# Patient Record
Sex: Female | Born: 1945 | Race: White | Hispanic: No | Marital: Married | Smoking: Never smoker
Health system: Southern US, Community
[De-identification: ages and names within clinical notes are randomized; demographics above are authoritative.]

## PROBLEM LIST (undated history)

## (undated) DIAGNOSIS — C679 Malignant neoplasm of bladder, unspecified: Secondary | ICD-10-CM

## (undated) DIAGNOSIS — K219 Gastro-esophageal reflux disease without esophagitis: Secondary | ICD-10-CM

## (undated) DIAGNOSIS — I10 Essential (primary) hypertension: Secondary | ICD-10-CM

## (undated) DIAGNOSIS — N19 Unspecified kidney failure: Secondary | ICD-10-CM

## (undated) HISTORY — DX: Essential (primary) hypertension: I10

## (undated) HISTORY — DX: Gastro-esophageal reflux disease without esophagitis: K21.9

## (undated) HISTORY — DX: Malignant neoplasm of bladder, unspecified: C67.9

## (undated) HISTORY — PX: NEPHROSTOMY: SHX1014

## (undated) HISTORY — DX: Hypomagnesemia: E83.42

## (undated) HISTORY — DX: Unspecified kidney failure: N19

## (undated) HISTORY — PX: ABDOMINAL HYSTERECTOMY: SHX81

---

## 2010-06-06 LAB — SURGICAL PCR SCREEN: Staphylococcus aureus: NEGATIVE

## 2010-06-06 LAB — CBC
HCT: 40.3 % (ref 36.0–46.0)
Hemoglobin: 13.6 g/dL (ref 12.0–15.0)
MCH: 31 pg (ref 26.0–34.0)
RBC: 4.39 MIL/uL (ref 3.87–5.11)

## 2010-06-06 LAB — BASIC METABOLIC PANEL
CO2: 29 mEq/L (ref 19–32)
Calcium: 9.4 mg/dL (ref 8.4–10.5)
Chloride: 106 mEq/L (ref 96–112)
GFR calc Af Amer: >60 mL/min (ref >60)
Potassium: 4.2 mEq/L (ref 3.5–5.1)
Sodium: 141 mEq/L (ref 135–145)

## 2010-06-15 ENCOUNTER — Inpatient Hospital Stay (HOSPITAL_COMMUNITY)
Admission: RE | Admit: 2010-06-15 | Discharge: 2010-06-20 | DRG: 658 | Disposition: A | Discharge status: Home / Self Care | Payer: Managed Care, Other (non HMO) | Source: Ambulatory Visit | Attending: Urology | Admitting: Urology

## 2010-06-15 ENCOUNTER — Other Ambulatory Visit: Payer: Self-pay | Admitting: Urology

## 2010-06-15 DIAGNOSIS — K219 Gastro-esophageal reflux disease without esophagitis: Secondary | ICD-10-CM | POA: Diagnosis present

## 2010-06-15 DIAGNOSIS — I10 Essential (primary) hypertension: Secondary | ICD-10-CM | POA: Diagnosis present

## 2010-06-15 DIAGNOSIS — Q628 Other congenital malformations of ureter: Secondary | ICD-10-CM

## 2010-06-15 DIAGNOSIS — C659 Malignant neoplasm of unspecified renal pelvis: Principal | ICD-10-CM | POA: Diagnosis present

## 2010-06-15 LAB — TYPE AND SCREEN: Antibody Screen: NEGATIVE

## 2010-06-15 LAB — ABO/RH: ABO/RH(D): A POS

## 2010-06-15 LAB — BASIC METABOLIC PANEL
BUN: 7 mg/dL (ref 6–23)
CO2: 26 mEq/L (ref 19–32)
Chloride: 109 mEq/L (ref 96–112)
Creatinine, Ser: 0.94 mg/dL (ref 0.4–1.2)

## 2010-06-15 LAB — HEMOGLOBIN AND HEMATOCRIT, BLOOD: HCT: 33.6 % — ABNORMAL LOW (ref 36.0–46.0)

## 2010-06-16 LAB — HEMOGLOBIN AND HEMATOCRIT, BLOOD: Hemoglobin: 10.2 g/dL — ABNORMAL LOW (ref 12.0–15.0)

## 2010-06-16 LAB — BASIC METABOLIC PANEL
BUN: 8 mg/dL (ref 6–23)
Chloride: 109 mEq/L (ref 96–112)
Glucose, Bld: 170 mg/dL — ABNORMAL HIGH (ref 70–99)
Potassium: 4.2 mEq/L (ref 3.5–5.1)

## 2010-06-17 LAB — BASIC METABOLIC PANEL
BUN: 8 mg/dL (ref 6–23)
CO2: 28 mEq/L (ref 19–32)
Chloride: 108 mEq/L (ref 96–112)
Creatinine, Ser: 1.19 mg/dL (ref 0.4–1.2)

## 2010-06-18 NOTE — Op Note (Signed)
NAMEJULIETT, Jodi [name]                ACCOUNT NO.:  [id number]  MEDICAL RECORD NO.:  [id number]           PATIENT TYPE:  I  LOCATION:  0005                         FACILITY:  [facility]  PHYSICIAN:  Heloise Purpura, MD      DATE OF BIRTH:  March 15, 1946  DATE OF PROCEDURE:  06/15/2010 DATE OF DISCHARGE:                              OPERATIVE REPORT   PREOPERATIVE DIAGNOSIS:  Right renal pelvic urothelial carcinoma.  POSTOPERATIVE DIAGNOSIS:  Right renal pelvic urothelial carcinoma.  PROCEDURE:  Right laparoscopic nephroureterectomy with open distal ureterectomy.  SURGEON:  Heloise Purpura, MD  ASSISTANT:  Delia Chimes, NP  ANESTHESIA:  General.  COMPLICATIONS:  None.  ESTIMATED BLOOD LOSS:  400 cc.  INTRAVENOUS FLUIDS:  3800 cc of crystalloid.  SPECIMENS: 1. Right kidney and ureter. 2. Right distal ureter and bladder cuff.  DISPOSITION:  Specimens sent to Pathology.  DRAINS: 1. A 16-French 3-way Foley catheter. 2. A #15 Blake pelvic drain.  INDICATION:  Ms. Buis is a 65 year old female who developed gross hematuria and underwent an evaluation including a CT scan of the abdomen and pelvis which demonstrated findings concerning for a filling defect in the right renal pelvis.  She was taken to the operating room by Dr. Theresia Lo in [place], [place] and she underwent ureteroscopy and was found have multifocal tumors in the right renal pelvis.  These were biopsy-proven urothelial carcinoma, and after discussing management options for treatment and undergoing further evaluation excluding any other abnormalities, she elected to proceed with the above procedures. 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  The potential risks, complications, and alternative options associated with the above procedure were discussed in detail and informed  consent was obtained.  DESCRIPTION OF PROCEDURE:  The patient was taken to the operating room and a general anesthetic was administered.  She was given preoperative antibiotics, placed in the right modified flank position, and prepped and draped in the usual sterile fashion.  Care was taken to pad all potential pressure points.  A preoperative time-out was then performed. A site was selected just superior to the umbilicus for placement of the camera port.  This was placed using a standard open Hasson technique allowing entry into the peritoneal cavity under direct vision without difficulty.  0 Vicryl holding sutures were then placed into the fascia and a 12-mm Hasson cannula was placed.  A pneumoperitoneum was then established.  With a 30-degree lens, the abdomen was inspected and there was no evidence of any intra-abdominal injuries or other abnormalities. She was noted to have a large liver overlying the right kidney and required liver retraction.  A 5-mm port was placed in the right upper quadrant and a 12-mm port was placed in the right lower quadrant.  An additional 5-mm port was then placed in the far right lateral abdominal wall and a self-retaining liver retractor was placed to retract the liver cephalad.  Using the harmonic scalpel, the colon was reflected medially allowing entry  into the retroperitoneum and the ureter and gonadal vein were identified and lifted anteriorly.  A small gonadal artery was identified and was ligated between multiple Hem-o-lok clips. Dissection then proceeded superiorly toward the main renal hilum.  The patient was noted to have a branching renal artery with 2 larger renal arteries and 1 smaller renal artery extending posterior to the renal vein.  She had a solitary renal vein which was identified.  Each of the renal artery branches was isolated with right-angle dissection and divided after ligation with 3 Hem-o-lok clips.  Once all arterial  inflow was ligated and divided, attention was turned to the renal vein.  Right- angle dissection was used to isolate the renal vein from the surrounding perinephric tissues.  Initially, a 45-mm Flex ETS stapler was placed onto the renal vein and preparations were made to fire.  However, the staple did not fire and it was able to be released without any untoward effects.  The staple was then removed and three 15-mm Hem-o-lok clips were placed onto the renal vein on the inferior vena cava side with one 15-mm clip placed on the kidney side.  The renal vein was then divided and the hepatorenal ligaments were taken down with the harmonic scalpel allowing the kidney to be freed.  The lateral attachments to the kidney were then also taken down with the harmonic scalpel and dissection proceeded inferiorly.  The ureter was then taken down into the pelvis and the colon was mobilized medially.  The ureter was freed up to a point overlying the iliac vessels.  The renal fossa was then examined and irrigated.  Hemostasis appeared excellent, and the liver retractor was taken down and removed.  The 12-mm port sites were closed with 0 Vicryl stitches placed laparoscopically and all remaining ports were removed under direct vision.  A lower midline incision was then made via the patient's prior lower midline scar and taken down through the subcutaneous tissues with electrocautery.  The rectus fascia was then identified and divided in the midline allowing entry into the peritoneal cavity.  The space of Retzius had been partially developed laparoscopically and now was completely developed allowing mobilization of the bladder.  The patient was noted to have a very deep and narrow pelvis and this made the ureteral dissection somewhat more technically challenging.  The surrounding small vascular structures were ligated with 5-mm Hem-o-lok clips and divided, and the ureter was taken down into the pelvis just  outside the bladder.  The patient's bladder was opened vertically allowing exposure of the right ureteral orifice. During dissection, the ureter became dislodged and the kidney was removed with the majority of the ureter.  However, there was a distal ureteral remnant.  This was then removed from inside the bladder with a 2-0 Vicryl stitch placed into the ureteral orifice.  The bladder mucosa was then incised with electrocautery, and the distal ureter was dissected free so that the ureter was removed in its entirety.  This hiatus in the bladder was then repaired with a 2-0 Vicryl running suture.  The bladder was then closed in 2 layers with 2 separate 2-0 Vicryl running sutures including an imbricating layer.  The bladder was then filled with sterile saline and there did not appear to be any leak from the bladder.  A #15 Blake drain was then brought through a separate stab incision in the right lower quadrant and positioned in the perivesical area.  It was secured to the skin with a nylon suture.  Attention then turned to closure of the rectus fascia.  Two separate #1 PDS sutures were used to close the fascia and secured in the midline of the incision.  The subcutaneous incision was then copiously irrigated and reapproximated at the skin level with staples along with the remaining laparoscopic incisions. Sterile dressings were applied.  The patient appeared to tolerate the procedure well and without complications.  She was able to be extubated and transferred to the recovery unit in satisfactory condition.     Heloise Purpura, MD     LB/MEDQ  D:  06/15/2010  T:  06/16/2010  Job:  [id number]  Electronically Signed by Heloise Purpura MD on 06/18/2010 09:21:47 PM

## 2010-06-19 ENCOUNTER — Inpatient Hospital Stay (HOSPITAL_COMMUNITY): Payer: Managed Care, Other (non HMO)

## 2010-06-19 LAB — BASIC METABOLIC PANEL
BUN: 6 mg/dL (ref 6–23)
CO2: 27 mEq/L (ref 19–32)
Chloride: 109 mEq/L (ref 96–112)
Glucose, Bld: 116 mg/dL — ABNORMAL HIGH (ref 70–99)
Potassium: 3.6 mEq/L (ref 3.5–5.1)

## 2010-06-19 MED ORDER — DIATRIZOATE MEGLUMINE 18 % UR SOLN: 125.0000 mL | Freq: Once | URETHRAL | Status: AC | PRN

## 2010-07-14 NOTE — Discharge Summary (Signed)
[name], [name]                ACCOUNT NO.:  [id number]  MEDICAL RECORD NO.:  [id number]           PATIENT TYPE:  I  LOCATION:  1433                         FACILITY:  [facility]  PHYSICIAN:  Jodi Purpura, MD      DATE OF BIRTH:  1945-11-19  DATE OF ADMISSION:  06/15/2010 DATE OF DISCHARGE:  06/20/2010                              DISCHARGE SUMMARY   ADMISSION DIAGNOSIS:  Right renal pelvic urothelial carcinoma.  DISCHARGE DIAGNOSIS:  Right renal pelvic urothelial carcinoma.  PROCEDURE:  Right laparoscopic nephroureterectomy with open distal ureterectomy.  HISTORY AND PHYSICAL:  For full details, please see admission history and physical.  Briefly, Jodi [name] is a 65 year old female who developed gross hematuria and underwent an evaluation including a CT scan of the abdomen and pelvis, which demonstrated findings concerning for a filling defect in the right renal pelvis.  She was taken to the operating room by Dr. Theresia [name] in [place], [place], and underwent ureteroscopy and was found to have multifocal tumors in the right renal pelvis.  These were biopsy-proven urothelial carcinoma and after discussing management options for treatment and undergoing further evaluation excluding any other abnormalities, she elected to proceed with surgical therapy and a laparoscopic right nephroureterectomy with open distal ureterectomy.  HOSPITAL COURSE:  On June 15, 2010, she was taken to the operating room where she underwent the above named procedures, in which she tolerated well and without complications.  Postoperatively, she was able to be transferred to a regular hospital room following recovery from anesthesia.  She was found to be hemodynamically stable as noted by postoperative hemoglobin of 11.6.  She was also hemodynamically stable on postoperative day #1 as noted by hemoglobin of 10.2.  Her serum creatinine remained within normal limits as noted postoperatively at 0.94  and on postoperative day #1 at 1.19.  Postoperatively, she was able to begin clear liquids that evening.  She was able to be transitioned to a regular diet over the course of the next 24-48 hours, in which she tolerated without nausea or vomiting.  She was also able to begin ambulation postoperatively, in which she did without any difficulty.  On postoperative day #3, she was found to have excellent urine output with minimal output from her perinephric drain, therefore, fluid was sent from her drain to check for creatinine.  It was found to be consistent with serum at 1.0, therefore her perinephric drain was removed.  On postoperative day #4, her cystogram was performed in Radiology, which showed no leak at her anastomotic site.  During her cystogram, she did have what seemed to be a vasovagal episode with complaints of nausea. She denied any chest pain or shortness of breath.  Although she did complain of some nausea and dizziness.  Therefore, it was decided that she would stay for further evaluation.  On the morning of postoperative day #5, she had no further episodes nor no further complaints of nausea or dizziness.  She was ambulating without difficulty and tolerating a regular diet.  Her pain was well managed.  Therefore, her Foley catheter was removed and she was able to be  discharged home in excellent condition.  DISPOSITION:  Home.  DISCHARGE INSTRUCTIONS:  She was instructed to be ambulatory, but specifically told to refrain from any heavy lifting, strenuous activity, or driving.  She was instructed to resume a regular diet.  DISCHARGE MEDICATIONS:  She was instructed to resume all home medications consisting of Prevacid, Bystolic, Tylenol, meclizine, Veramyst, Zyrtec, lisinopril, and Norvasc.  In addition, she was provided a prescription for Colace to use daily and Percocet to use as needed for pain.  She was also prescribed a prescription for Cipro to take twice daily for 3  days.  FOLLOWUP:  She will follow up in 10-14 days for further postoperative evaluation.  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

## 2011-02-12 ENCOUNTER — Other Ambulatory Visit: Payer: Self-pay

## 2017-08-30 ENCOUNTER — Inpatient Hospital Stay (HOSPITAL_COMMUNITY): Payer: Medicare Other | Attending: Hematology | Admitting: Hematology

## 2017-08-30 ENCOUNTER — Encounter (HOSPITAL_COMMUNITY): Payer: Self-pay | Admitting: Hematology

## 2017-08-30 DIAGNOSIS — R0609 Other forms of dyspnea: Secondary | ICD-10-CM | POA: Insufficient documentation

## 2017-08-30 DIAGNOSIS — C689 Malignant neoplasm of urinary organ, unspecified: Secondary | ICD-10-CM

## 2017-08-30 DIAGNOSIS — R634 Abnormal weight loss: Secondary | ICD-10-CM | POA: Insufficient documentation

## 2017-08-30 DIAGNOSIS — Z9221 Personal history of antineoplastic chemotherapy: Secondary | ICD-10-CM | POA: Insufficient documentation

## 2017-08-30 DIAGNOSIS — Z79899 Other long term (current) drug therapy: Secondary | ICD-10-CM | POA: Diagnosis not present

## 2017-08-30 DIAGNOSIS — I10 Essential (primary) hypertension: Secondary | ICD-10-CM | POA: Diagnosis not present

## 2017-08-30 DIAGNOSIS — Z7189 Other specified counseling: Secondary | ICD-10-CM

## 2017-08-30 DIAGNOSIS — C679 Malignant neoplasm of bladder, unspecified: Secondary | ICD-10-CM | POA: Insufficient documentation

## 2017-08-30 DIAGNOSIS — K219 Gastro-esophageal reflux disease without esophagitis: Secondary | ICD-10-CM | POA: Diagnosis not present

## 2017-08-30 DIAGNOSIS — C7800 Secondary malignant neoplasm of unspecified lung: Secondary | ICD-10-CM | POA: Diagnosis not present

## 2017-08-30 NOTE — Patient Instructions (Signed)
[facility] Discharge Instructions  Today you saw Dr. K.   Thank you for choosing [facility] to provide your oncology and hematology care.  To afford each patient quality time with our provider, please arrive at least 15 minutes before your scheduled appointment time.   If you have a lab appointment with the Cancer Center please come in thru the  Main Entrance and check in at the main information desk  You need to re-schedule your appointment should you arrive 10 or more minutes late.  We strive to give you quality time with our providers, and arriving late affects you and other patients whose appointments are after yours.  Also, if you no show three or more times for appointments you may be dismissed from the clinic at the providers discretion.     Again, thank you for choosing [facility].  Our hope is that these requests will decrease the amount of time that you wait before being seen by our physicians.       _____________________________________________________________  Should you have questions after your visit to [facility], please contact our office at [phone] between the hours of 8:30 a.m. and 4:30 p.m.  Voicemails left after 4:30 p.m. will not be returned until the following business day.  For prescription refill requests, have your pharmacy contact our office.       Resources For Cancer Patients and their Caregivers ? American Cancer Society: Can assist with transportation, wigs, general needs, runs Look Good Feel Better.        1-888-227-6333 ? Cancer Care: Provides financial assistance, online support groups, medication/co-pay assistance.  1-800-813-HOPE (4673) ? [facility] Assists [place] cancer patients and their families through emotional , educational and financial support.  336-427-4357 ? [place] DSS Where to apply for food  stamps, Medicaid and utility assistance. 336-342-1394 ? [facility]: Transportation to medical appointments. 336-347-2287 ? Social Security Administration: May apply for disability if have a Stage IV cancer. 336-342-7796 1-800-772-1213 ? [place] Aging, Disability and Transit Services: Assists with nutrition, care and transit needs. 336-349-2343  Cancer Center Support Programs:   > Cancer Support Group  2nd Tuesday of the month 1pm-2pm, Journey Room   > Creative Journey  3rd Tuesday of the month 1130am-1pm, Journey Room    

## 2017-08-30 NOTE — Progress Notes (Signed)
[facility] CONSULT NOTE  Patient Care Team: Joseph Art, MD as PCP - General (Internal Medicine)  CHIEF COMPLAINTS/PURPOSE OF CONSULTATION:  Metastatic bladder cancer with lung mets  HISTORY OF PRESENTING ILLNESS:  Jodi [name] 72 y.o. female is here for initial consultation for metastatic bladder cancer with lung mets.  She is a previous patient of Dr. Tomie China in [place], [place] who has elected to transfer her care to [facility].    Here today with her husband and daughter. Her husband Greenlee Ancheta) is also a patient of Dr. Tomie China who has recently transferred his care to [facility].    Recent CT scans from [place], [place] reviewed. There may have been slight progression of lung lesions at that time. She has been on Alimta; last chemo 07/19/17 Her last lung biopsy was ~2 years ago by Dr. Luciana Axe in [place], [place].   Cough is stable. She is able to do ADLs with rest breaks; this is her baseline.  Her shortness of breath with exertion and her cough have not worsened.  She has occasional productive cough "that is a light color and not a lot."  She has lost ~6 lbs or so in the past 1-2 months.  Appetite has not been very good this week; she has not been taking her appetite stimulant medication (Megace). She does not like to take it often because "it makes me wake up in the middle of the night hungry."   Overall, she feels like her appetite is okay.   Recommended sending off Foundation One, if it has not already been done in the past.  Will change treatment to Taxotere/Cyramza. Will try to get her approved to start therapy soon.     MEDICAL HISTORY:  Past Medical History:  Diagnosis Date  . Bladder cancer ([place])   . GERD (gastroesophageal reflux disease)   . Hypertension   . Hypomagnesemia   . Renal failure     SURGICAL HISTORY: History reviewed. No pertinent surgical history.  SOCIAL HISTORY: Social History   Socioeconomic  History  . Marital status: Married    Spouse name: Not on file  . Number of children: Not on file  . Years of education: Not on file  . Highest education level: Not on file  Occupational History  . Not on file  Social Needs  . Financial resource strain: Not on file  . Food insecurity:    Worry: Not on file    Inability: Not on file  . Transportation needs:    Medical: Not on file    Non-medical: Not on file  Tobacco Use  . Smoking status: Not on file  Substance and Sexual Activity  . Alcohol use: Not on file  . Drug use: Not on file  . Sexual activity: Not on file  Lifestyle  . Physical activity:    Days per week: Not on file    Minutes per session: Not on file  . Stress: Not on file  Relationships  . Social connections:    Talks on phone: Not on file    Gets together: Not on file    Attends religious service: Not on file    Active member of club or organization: Not on file    Attends meetings of clubs or organizations: Not on file    Relationship status: Not on file  . Intimate partner violence:    Fear of current or ex partner: Not on file  Emotionally abused: Not on file    Physically abused: Not on file    Forced sexual activity: Not on file  Other Topics Concern  . Not on file  Social History Narrative  . Not on file    FAMILY HISTORY: History reviewed. No pertinent family history.  ALLERGIES:  is allergic to lortab [hydrocodone-acetaminophen].  MEDICATIONS:  Current Outpatient Medications  Medication Sig Dispense Refill  . ALPRAZolam (XANAX PO) Take by mouth as needed.    . cholecalciferol (VITAMIN D) 400 units TABS tablet Take 400 Units by mouth daily.    . Cyanocobalamin (VITAMIN B 12 PO) Take 1 mg by mouth daily.    Marland Kitchen diltiazem (CARDIZEM CD) 240 MG 24 hr capsule 1 TABLET AT THE SAME TIME EACH DAY ONCE A DAY ORALLY  2  . folic acid (FOLVITE) 1 MG tablet Take 1 mg by mouth daily.  3  . guaiFENesin-codeine 100-10 MG/5ML syrup TAKE 10 ML BY MOUTH 4  TIMES A DAY AS NEEDED COUGH  1  . lansoprazole (PREVACID) 30 MG capsule Take 30 mg by mouth daily at 12 noon.    . magnesium oxide (MAG-OX) 400 MG tablet Take 1 tablet by mouth 3 (three) times daily.  3  . meclizine (ANTIVERT) 25 MG tablet Take 25 mg by mouth 3 (three) times daily as needed for dizziness.    . megestrol (MEGACE) 40 MG/ML suspension Take by mouth 2 (two) times daily.    . ondansetron (ZOFRAN-ODT) 4 MG disintegrating tablet Take 4 mg by mouth 3 (three) times daily.  3  . prochlorperazine (COMPAZINE) 10 MG tablet Take 10 mg by mouth every 6 (six) hours as needed for nausea or vomiting.    . TRANSDERM-SCOP, 1.5 MG, 1 MG/3DAYS APPLY 1 PATCH EVERY 72 HOURS  6   No current facility-administered medications for this visit.     REVIEW OF SYSTEMS:   Constitutional: Denies fevers, chills or abnormal night sweats Eyes: Denies blurriness of vision, double vision or watery eyes Ears, nose, mouth, throat, and face: Denies mucositis or sore throat Respiratory: Nonproductive cough is stable.  Dyspnea on exertion. Cardiovascular: Denies palpitation, chest discomfort or lower extremity swelling Gastrointestinal:  Denies nausea, heartburn or change in bowel habits Skin: Denies abnormal skin rashes Lymphatics: Denies new lymphadenopathy or easy bruising Neurological:Denies numbness, tingling or new weaknesses Behavioral/Psych: Mood is stable, no new changes  All other systems were reviewed with the patient and are negative.  PHYSICAL EXAMINATION: ECOG PERFORMANCE STATUS: 1 - Symptomatic but completely ambulatory  Vitals:   08/30/17 1014  BP: (!) 140/92  Pulse: (!) 128  Resp: 18  Temp: 98.2 F (36.8 C)  SpO2: 97%   Filed Weights   08/30/17 1014  Weight: 117 lb 12.8 oz (53.4 kg)    GENERAL:alert, no distress and comfortable SKIN: skin color, texture, turgor are normal, no rashes or significant lesions EYES: normal, conjunctiva are pink and non-injected, sclera  clear OROPHARYNX:no exudate, no erythema and lips, buccal mucosa, and tongue normal  NECK: supple, thyroid normal size, non-tender, without nodularity LYMPH:  no palpable lymphadenopathy in the cervical, axillary or inguinal LUNGS: clear to auscultation and percussion with normal breathing effort HEART: regular rate & rhythm and no murmurs and no lower extremity edema ABDOMEN:abdomen soft, non-tender and normal bowel sounds Musculoskeletal:no cyanosis of digits and no clubbing  PSYCH: alert & oriented x 3 with fluent speech NEURO: no focal motor/sensory deficits  LABORATORY DATA:  I have reviewed the data as listed Lab Results  Component Value Date   WBC 6.9 06/06/2010   HGB 10.2 (L) 06/16/2010   HCT 30.0 (L) 06/16/2010   MCV 91.8 06/06/2010   PLT 207 06/06/2010     Chemistry      Component Value Date/Time   NA 141 06/19/2010 0500   K 3.6 DELTA CHECK NOTED 06/19/2010 0500   CL 109 06/19/2010 0500   CO2 27 06/19/2010 0500   BUN 6 06/19/2010 0500   CREATININE 0.96 06/19/2010 0500      Component Value Date/Time   CALCIUM 8.1 (L) 06/19/2010 0500       RADIOGRAPHIC STUDIES: I have personally reviewed the radiological images as listed and agreed with the findings in the report. CT scan dated 08/02/2017 shows interval worsening of diffuse bilateral pulmonary metastatic disease presented by solid and somewhat cavitary lung masses.  There has been subtle increase in the largest masses on the right and the right main to lung field and especially in the anterior medial right upper lobe and right lung apex.  ASSESSMENT & PLAN:  Bladder cancer metastasized to lung ([place]) 1.  Metastatic bladder cancer with lung, liver metastasis, mediastinal and abdominal adenopathy: - 6 cycles of gemcitabine and carboplatin from 06/08/2015 through 10/27/2015 - 8 cycles of Atezolizumab from 12/29/2015 through 07/05/2016, progressive disease in the liver, mediastinal and abdominal adenopathy -5 cycles of  Abraxane from 08/31/2016 through 12/28/2016, stable disease, discontinued secondary to pneumonitis - Single agent pemetrexed from 02/14/2017 through 07/19/2017, CT scan on 08/02/2017 showing some interval worsening of bilateral pulmonary metastatic disease with possible one new lung lesion. - Stable functional status with the ability to continue doing ADLs and IADLs, stable weight, stable cough - We have discussed further options including the results of RANGE trial using docetaxel 75 mg/m and Cyramza 10 mg/kg every 21-days which showed superior progression free survival over single agent chemotherapy.  We have also discussed about new FGFR3 inhibitor, Erdafitinib, which was FDA approved for metastatic bladder cancer.  We will plan to send her tumor for foundation one testing.  If there is no viable tumor, we will plan to do guardant 360.  We plan to start her on chemotherapy next week.  We talked about side effects of chemotherapy in detail.  2.  Nonproductive cough: She has cough predominantly at nighttime, tumor related.  She uses Cheratussin as needed.  3.  Weight loss: She will continue using Megace as needed.  Her weight has been stable lately.  No orders of the defined types were placed in this encounter.   All questions were answered. The patient knows to call the clinic with any problems, questions or concerns.   This note includes documentation from Mike Craze, NP, who was present during this patient's office visit and evaluation.  I have reviewed this note for its completeness and accuracy.  I have edited this note accordingly based on my findings and medical opinion.      Orders placed this encounter:  No orders of the defined types were placed in this encounter.    Derek Jack, MD 08/30/2017 7:22 PM

## 2017-08-30 NOTE — Assessment & Plan Note (Signed)
1.  Metastatic bladder cancer with lung, liver metastasis, mediastinal and abdominal adenopathy: - 6 cycles of gemcitabine and carboplatin from 06/08/2015 through 10/27/2015 - 8 cycles of Atezolizumab from 12/29/2015 through 07/05/2016, progressive disease in the liver, mediastinal and abdominal adenopathy -5 cycles of Abraxane from 08/31/2016 through 12/28/2016, stable disease, discontinued secondary to pneumonitis - Single agent pemetrexed from 02/14/2017 through 07/19/2017, CT scan on 08/02/2017 showing some interval worsening of bilateral pulmonary metastatic disease with possible one new lung lesion. - Stable functional status with the ability to continue doing ADLs and IADLs, stable weight, stable cough - We have discussed further options including the results of RANGE trial using docetaxel 75 mg/m and Cyramza 10 mg/kg every 21-days which showed superior progression free survival over single agent chemotherapy.  We have also discussed about new FGFR3 inhibitor, Erdafitinib, which was FDA approved for metastatic bladder cancer.  We will plan to send her tumor for foundation one testing.  If there is no viable tumor, we will plan to do guardant 360.  We plan to start her on chemotherapy next week.  We talked about side effects of chemotherapy in detail.  2.  Nonproductive cough: She has cough predominantly at nighttime, tumor related.  She uses Cheratussin as needed.  3.  Weight loss: She will continue using Megace as needed.  Her weight has been stable lately.

## 2017-08-31 DIAGNOSIS — Z7189 Other specified counseling: Secondary | ICD-10-CM | POA: Insufficient documentation

## 2017-08-31 DIAGNOSIS — C689 Malignant neoplasm of urinary organ, unspecified: Secondary | ICD-10-CM | POA: Insufficient documentation

## 2017-08-31 NOTE — Progress Notes (Signed)
START OFF PATHWAY REGIMEN - Bladder   [id number]:Docetaxel + Ramucirumab q21 Days:   A cycle is every 21 days:     Ramucirumab      Docetaxel   **Always confirm dose/schedule in your pharmacy ordering system**    Patient Characteristics: Metastatic Disease, Third Line and Beyond AJCC M Category: M1b AJCC N Category: NX AJCC T Category: TX Current evidence of distant metastases<= Yes AJCC 8 Stage Grouping: IVB Line of Therapy: Third Line and Beyond  Intent of Therapy: Non-Curative / Palliative Intent, Discussed with Patient

## 2017-09-03 ENCOUNTER — Encounter: Payer: Self-pay | Admitting: General Practice

## 2017-09-03 NOTE — Progress Notes (Signed)
[facility] CSW Progress Notes  Attempt to reach patient to assess for psychosocial needs; both phone numbers in chart are not working numbers.  Unable to reach patient.  Edwyna Shell, LCSW Clinical Social Worker Phone:  [phone]

## 2017-09-04 ENCOUNTER — Encounter (HOSPITAL_COMMUNITY): Payer: Self-pay | Admitting: Emergency Medicine

## 2017-09-04 MED ORDER — LIDOCAINE-PRILOCAINE 2.5-2.5 % EX CREA: TOPICAL_CREAM | CUTANEOUS | 2 refills | Status: DC

## 2017-09-04 NOTE — Progress Notes (Signed)
Chemotherapy teaching pulled together. 

## 2017-09-04 NOTE — Patient Instructions (Signed)
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   We are going to treat you with taxotere and cyramza.  This will be given every 21 days.  This treatment is with palliative intent, which means you are treatable but not curable.  You will see the doctor regularly throughout treatment.  We monitor your lab work prior to every treatment.  The doctor monitors your response to treatment by the way you are feeling, your blood work, and scans periodically.  There will be wait times while you are here for treatment.  It will take about 30 minutes to 1 hour for blood work to come back.  Then pharmacy has to mix your medications.    Premedications given before chemotherapy include: Premeds: Benadryl and Tylenol: help prevent any kind of reaction to the chemotherapy.   Dexamethasone - steroid - given to reduce the risk of you having an allergic type reaction to the chemotherapy. Dex can cause you to feel energized, nervous/anxious/jittery, make you have trouble sleeping, and/or make you feel hot/flushed in the face/neck and/or look pink/red in the face/neck. These side effects will pass as the Dex wears off. (takes 20 minutes to infuse)   POTENTIAL SIDE EFFECTS OF TREATMENT: Docetaxel (Generic Name) Other Names: Taxotere  About This Drug Docetaxel is used to treat cancer. This drug is given in the vein (IV).  This will take 1 hour to infuse.  Possible Side Effects (More Common) . Bone marrow depression. This is a decrease in the number of white blood cells, red blood cells, and platelets. This may raise your risk of infection, make you tired and weak (fatigue), and raise your risk of bleeding. . Swelling of your legs, ankles, and/or feet. Steroids are often given to lessen this swelling. . Hair loss: Hair loss is often complete scalp hair loss and can involve loss of eyebrows, eyelashes, and pubic hair. You may notice this a few days or weeks  after treatment has started. There have been cases of permanent hair loss reported. . Loose bowel movements (diarrhea) that may last for a few days. . Nausea and throwing up (vomiting). These symptoms may happen within a few hours after your treatment and may last up to 24 hours. Medicines are available to stop or lessen these side effects. . Soreness of the mouth and throat. You may have red areas, white patches, or sores that hurt. . Effects on the nerves are called peripheral neuropathy. You may feel numbness, tingling, or pain in your hands and feet. It may be hard for you to button your clothes, open jars, or walk as usual. The effect on the nerves may get worse with more doses of the drug. These effects get better in some people after the drug is stopped but it does not get better in all people. . Skin and nail changes. You may develop a rash or have skin redness and swelling followed by peeling. Nail problems may occur such as changes in nail color, nails becoming thin or brittle, or loss of the nail. Steroids are often given to lessen these side effects. . Weakness that interferes with your daily activities. . This drug contains alcohol and may affect your central nervous system. The central nervous system is made up of your brain and spinal cord. You may feel drunk during and after your treatment and it can impair your ability to drive or use machinery for one to two hours after infusion.  Possible Side Effects (Less Common) . Skin and tissue irritation may involve redness, pain, warmth, or swelling at the IV site. This happens if the drug leaks out of the vein and into nearby tissue. . Changes in your liver function. Your doctor will check your liver function as needed. . Muscle and joint pain. . Effects on the heart: This drug can weaken the heart and lower heart function. Your heart function will be checked as needed. You may have trouble catching your breath, mainly during activities. You  may also have trouble breathing while lying down, and have swelling in your ankles. . This drug may cause an increased risk of developing a second cancer. . Skin and tissue irritation may involve redness, pain, warmth, or swelling at the IV site. This happens if the drug leaks out of the vein and into nearby tissue. Marland Kitchen Blurred vision or other changes in eyesight. . A rare risk of death is increased in people with liver problems. Do not take this drug if you have liver disease and talk to your doctor.  Allergic Reactions Serious allergic reactions, including anaphylaxis are rare. While you are getting this drug in your vein (IV), tell your nurse right away if you have any of these symptoms of an allergic reaction: . Trouble catching your breath . Feeling like your tongue or throat are swelling . Feeling your heart beat quickly or in a not normal way (palpitations) . Feeling dizzy or lightheaded . Flushing, itching, rash, and/or hives  Infusion Reactions While you are getting this drug in your vein (IV), you may have a reaction. Your nurse will check you closely for these signs: fever or shaking chills, flushing, facial swelling, feeling dizzy, headache, trouble breathing, rash, itching, chest tightness, or chest pain. Less serious reactions to this drug may also happen. You will be given medicines to help stop or lessen these symptoms. Your vital signs will be checked during the infusion. Tell your doctor or nurse right away if you have any of these symptoms at any time during the infusion and/or for the first 24 hours after getting this drug: . Fever, chills, or shaking chills . Feeling dizzy or lightheaded . Headache . Nausea or throwing up  Treating Side Effects . Drink 6-8 cups of fluids every day unless your doctor has told you to limit your fluid intake due to some other health problem. A cup is 8 ounces of fluid. If you throw up or have loose bowel . Talk with your nurse about getting a  wig before you lose your hair. Also, call the [facility] at 800-ACS-2345 to find out information about the " Look Good,Feel Better" program close to where you live. It is a free program where women undergoing chemotherapy can learn about wigs, turbans and scarves as well as makeup techniques and skin and nail care. . Do not put anything on a rash unless your doctor or nurse says you may. Keep the area around the rash clean and dry. Ask your doctor for medicine if your rash bothers you. . Mouth care is very important. Your mouth care should consist of routine, gentle cleaning of your teeth or dentures and rinsing your mouth with a mixture of 1/2 teaspoon of salt in 8 ounces of water or  teaspoon of baking soda in 8 ounces of water. This should be done at least after each meal and at bedtime. . If you have mouth sores, avoid mouthwash that has alcohol. Avoid alcohol and smoking  because they can bother your mouth and throat. . Ask your doctor or nurse about medicine to stop or lessen loose bowel movements, nausea, throwing up, and joint and muscle pain. . If you have numbness and tingling in your hands and feet, be careful when cooking, walking, and handling sharp objects and hot liquids.  Food and Drug Interactions There are no known interactions of docetaxel with food. This drug may interact with other medicines. Tell your doctor and pharmacist about all the medicines and dietary supplements (vitamins, minerals, herbs and others) that you are taking at this time. The safety and use of dietary supplements and alternative diets are often not known. Using these might affect your cancer or interfere with your treatment. Until more is known, you should not use dietary supplements or alternative diets without your cancer doctor's help.  When to Call the Doctor Call your doctor or nurse right away if you have any of these symptoms: . Fever of 100.5 F (38 C) or above . Chills . Easy bruising or  bleeding . Wheezing or trouble breathing . Rash or itching . Feeling dizzy or lightheaded . Loose bowel movements (diarrhea) more than 4 times a day or diarrhea with weakness or feeling lightheaded . Nausea that stops you from eating or drinking . Throwing up more than 3 times a day . Signs of liver problems: dark urine, pale bowel movements, bad stomach pain, feeling very tired and weak, unusual itching, or yellowing of the eyes or skin . Eye irritation, blurred vision or other changes in eyesight Call your doctor or nurse as soon as possible if any of these symptoms happen: . Decreased urine . Pain in your mouth or throat that makes it hard to eat or drink . Nausea that is not relieved by prescribed medicines . Rash that is not relieved by prescribed medicines . Numbness, tingling, decreased feeling or weakness in fingers, toes, arms, or legs . Trouble walking or changes in the way you walk, feeling clumsy when buttoning clothes, opening jars, or other routine hand motions . Swelling of legs, ankles, or feet . Weight gain of 5 pounds in one week (fluid retention) . Fatigue that interferes with your daily activities . Headache that does not go away . Extreme weakness that interferes with normal activities . While you are getting this drug, please tell your nurse right away if you have any pain, redness, or swelling at the site of the IV infusion . Symptoms of being drunk, confusion, or being very sleepy  Sexual Problems and Reproductive Concerns . Pregnancy warning: This drug may have harmful effects on the unborn child, so effective methods of birth control should be used during your cancer treatment. Genetic counseling is available for you to talk about the effects of this drug therapy on future pregnancies. Also, a genetic counselor can look at the possible risk of problems in the unborn baby due to this medicine if an exposure happens during pregnancy. . Breast feeding warning: It is  not known if this drug passes into breast milk. For this reason, women should talk to their doctor about the risks and benefits of breast feeding during treatment with this drug because this drug may enter the breast milk and badly harm a breast feeding baby.   Ramucirumab (Generic Name) Other Names: CyramzaTM  About This Drug Ramucirumab is a drug used to treat patients with certain types of stomach cancer. It is given in the vein (IV). Takes 1 hour to infuse.  Possible Side Effects (More Common) . High blood pressure . Loose bowel movements (diarrhea) . Infusion reactions. This can cause you to have shaking chills, back pain, flushing, and low blood pressure . Changes in liver function tests  Possible Side Effects (Less Common) . Headache . Low sodium level . Rash . Nose bleeds . Protein in the urine  Treating Side Effects . If you get a rash, do not put anything on it unless your doctor or nurse says you may. Keep the area around the rash clean and dry. Ask your doctor for medicine if your rash bothers you. A rash that looks like acne may happen on your face and upper back when taking this drug. Your doctor can give you medicine to help treat this. . Your doctor will check your sodium level, blood pressure, liver function tests and your urine while you are taking this drug. . If you have a nose bleed, sit with your head tipped slightly forward. Apply pressure by lightly pinching the bridge of your nose between your thumb and forefinger. Call your doctor if you feel dizzy or faint or if the bleeding doesn't stop after 10 to 15 minutes. . Ramucirumab may cause slow wound healing. Talk to your doctor before you have any surgeries. . Ask your doctor or nurse about medicine that is available to help stop or lessen the loose bowel movements. . Drink 6-8 cups of fluids each day unless your doctor has told you to limit your fluid intake due to some other health problem. A cup is 8 ounces of  fluid. If you throw up or have loose bowel movements, you should drink more fluids so that you do not become dehydrated (lack water in the body from losing too much fluid). Food and Drug Interactions . There are known interactions with ramucirumab and grapefruit. Do not eat grapefruit or drink grapefruit juice while taking this drug. . This drug may also interact with other medicines. Tell your doctor and pharmacist about all the medicines and dietary supplements (vitamins, minerals, herbs and others) that you are taking at this time. The safety and use of dietary supplements and alternative diets are often not known. Using these might affect your cancer or interfere with your treatment. Until more is known, you should not use dietary supplements or alternative diets without your cancer doctor's help.  When to Call the Doctor Call your doctor or nurse right away if you have any of these symptoms: . Headache . Nose bleed that does not stop after 10-15 minutes . Extreme fatigue that interferes with your daily activities . Loose bowel movements (diarrhea) 4 times or loose bowel movements with weakness or a feeling lightheaded Call your doctor or nurse as soon as possible if any of these symptoms happen: . Rash  Reproduction Concerns . Pregnancy warning: This drug may have harmful effects on the unborn baby so effective methods of birth control should be used by you and your partner during your cancer treatment and for at least 3 months after the last dose. . Genetic counseling is available for you to talk about the effects of this drug therapy on future pregnancies. Also, a genetic counselor can look at the possible risk of problems in the unborn baby due to this medicine if an exposure happens during pregnancy. . Breast feeding warning: Women should not breast feed during treatment because this drug could enter the breast milk and badly harm a breast feeding baby.    SELF CARE ACTIVITIES WHILE  ON  CHEMOTHERAPY: Hydration Increase your fluid intake 48 hours prior to treatment and drink at least 8 to 12 cups (64 ounces) of water/decaff beverages per day after treatment. You can still have your cup of coffee or soda but these beverages do not count as part of your 8 to 12 cups that you need to drink daily. No alcohol intake.  Medications Continue taking your normal prescription medication as prescribed.  If you start any new herbal or new supplements please let us know first to make sure it is safe.  Mouth Care Have teeth cleaned professionally before starting treatment. Keep dentures and partial plates clean. Use soft toothbrush and do not use mouthwashes that contain alcohol. Biotene is a good mouthwash that is available at most pharmacies or may be ordered by calling [phone]. Use warm salt water gargles (1 teaspoon salt per 1 quart warm water) before and after meals and at bedtime. Or you may rinse with 2 tablespoons of three-percent hydrogen peroxide mixed in eight ounces of water. If you are still having problems with your mouth or sores in your mouth please call the clinic. If you need dental work, please let the doctor know before you go for your appointment so that we can coordinate the best possible time for you in regards to your chemo regimen. You need to also let your dentist know that you are actively taking chemo. We may need to do labs prior to your dental appointment.  Skin Care Always use sunscreen that has not expired and with SPF (Sun Protection Factor) of 50 or higher. Wear hats to protect your head from the sun. Remember to use sunscreen on your hands, ears, face, & feet.  Use good moisturizing lotions such as udder cream, eucerin, or even Vaseline. Some chemotherapies can cause dry skin, color changes in your skin and nails.    . Avoid long, hot showers or baths. . Use gentle, fragrance-free soaps and laundry detergent. . Use moisturizers, preferably creams or  ointments rather than lotions because the thicker consistency is better at preventing skin dehydration. Apply the cream or ointment within 15 minutes of showering. Reapply moisturizer at night, and moisturize your hands every time after you wash them.  Hair Loss (if your doctor says your hair will fall out)  . If your doctor says that your hair is likely to fall out, decide before you begin chemo whether you want to wear a wig. You may want to shop before treatment to match your hair color. . Hats, turbans, and scarves can also camouflage hair loss, although some people prefer to leave their heads uncovered. If you go bare-headed outdoors, be sure to use sunscreen on your scalp. . Cut your hair short. It eases the inconvenience of shedding lots of hair, but it also can reduce the emotional impact of watching your hair fall out. . Don't perm or color your hair during chemotherapy. Those chemical treatments are already damaging to hair and can enhance hair loss. Once your chemo treatments are done and your hair has grown back, it's OK to resume dyeing or perming hair. With chemotherapy, hair loss is almost always temporary. But when it grows back, it may be a different color or texture. In older adults who still had hair color before chemotherapy, the new growth may be completely gray.  Often, new hair is very fine and soft.  Infection Prevention Please wash your hands for at least 30 seconds using warm soapy water. Handwashing is the #  1 way to prevent the spread of germs. Stay away from sick people or people who are getting over a cold. If you develop respiratory systems such as green/yellow mucus production or productive cough or persistent cough let us know and we will see if you need an antibiotic. It is a good idea to keep a pair of gloves on when going into grocery stores/[employer] to decrease your risk of coming into contact with germs on the carts, etc. Carry alcohol hand gel with you at all times and  use it frequently if out in public. If your temperature reaches 100.5 or higher please call the clinic and let us know.  If it is after hours or on the weekend please go to the ER if your temperature is over 100.5.  Please have your own personal thermometer at home to use.    Sex and bodily fluids If you are going to have sex, a condom must be used to protect the person that isn't taking chemotherapy. Chemo can decrease your libido (sex drive). For a few days after chemotherapy, chemotherapy can be excreted through your bodily fluids.  When using the toilet please close the lid and flush the toilet twice.  Do this for a few day after you have had chemotherapy.   Effects of chemotherapy on your sex life Some changes are simple and won't last long. They won't affect your sex life permanently. Sometimes you may feel: . too tired . not strong enough to be very active . sick or sore  . not in the mood . anxious or low Your anxiety might not seem related to sex. For example, you may be worried about the cancer and how your treatment is going. Or you may be worried about money, or about how you family are coping with your illness. These things can cause stress, which can affect your interest in sex. It's important to talk to your partner about how you feel. Remember - the changes to your sex life don't usually last long. There's usually no medical reason to stop having sex during chemo. The drugs won't have any long term physical effects on your performance or enjoyment of sex. Cancer can't be passed on to your partner during sex  Contraception It's important to use reliable contraception during treatment. Avoid getting pregnant while you or your partner are having chemotherapy. This is because the drugs may harm the baby. Sometimes chemotherapy drugs can leave a man or woman infertile.  This means you would not be able to have children in the future. You might want to talk to someone about permanent  infertility. It can be very difficult to learn that you may no longer be able to have children. Some people find counselling helpful. There might be ways to preserve your fertility, although this is easier for men than for women. You may want to speak to a fertility expert. You can talk about sperm banking or harvesting your eggs. You can also ask about other fertility options, such as donor eggs. If you have or have had breast cancer, your doctor might advise you not to take the contraceptive pill. This is because the hormones in it might affect the cancer.  It is not known for sure whether or not chemotherapy drugs can be passed on through semen or secretions from the vagina. Because of this some doctors advise people to use a barrier method if you have sex during treatment. This applies to vaginal, anal or oral sex. Generally, doctors  advise a barrier method only for the time you are actually having the treatment and for about a week after your treatment. Advice like this can be worrying, but this does not mean that you have to avoid being intimate with your partner. You can still have close contact with your partner and continue to enjoy sex.  Animals If you have cats or birds we just ask that you not change the litter or change the cage.  Please have someone else do this for you while you are on chemotherapy.   Food Safety During and After Cancer Treatment Food safety is important for people both during and after cancer treatment. Cancer and cancer treatments, such as chemotherapy, radiation therapy, and stem cell/bone marrow transplantation, often weaken the immune system. This makes it harder for your body to protect itself from foodborne illness, also called food poisoning. Foodborne illness is caused by eating food that contains harmful bacteria, parasites, or viruses.  Foods to avoid Some foods have a higher risk of becoming tainted with bacteria. These include: Marland Kitchen Unwashed fresh fruit and  vegetables, especially leafy vegetables that can hide dirt and other contaminants . Raw sprouts, such as alfalfa sprouts . Raw or undercooked beef, especially ground beef, or other raw or undercooked meat and poultry . Fatty, fried, or spicy foods immediately before or after treatment.  These can sit heavy on your stomach and make you feel nauseous. . Raw or undercooked shellfish, such as oysters. . Sushi and sashimi, which often contain raw fish.  . Unpasteurized beverages, such as unpasteurized fruit juices, raw milk, raw yogurt, or cider . Undercooked eggs, such as soft boiled, over easy, and poached; raw, unpasteurized eggs; or foods made with raw egg, such as homemade raw cookie dough and homemade mayonnaise Simple steps for food safety Shop smart. . Do not buy food stored or displayed in an unclean area. . Do not buy bruised or damaged fruits or vegetables. . Do not buy cans that have cracks, dents, or bulges. . Pick up foods that can spoil at the end of your shopping trip and store them in a cooler on the way home. Prepare and clean up foods carefully. . Rinse all fresh fruits and vegetables under running water, and dry them with a clean towel or paper towel. . Clean the top of cans before opening them. . After preparing food, wash your hands for 20 seconds with hot water and soap. Pay special attention to areas between fingers and under nails. . Clean your utensils and dishes with hot water and soap. Marland Kitchen Disinfect your kitchen and cutting boards using 1 teaspoon of liquid, unscented bleach mixed into 1 quart of water.   Dispose of old food. . Eat canned and packaged food before its expiration date (the "use by" or "best before" date). . Consume refrigerated leftovers within 3 to 4 days. After that time, throw out the food. Even if the food does not smell or look spoiled, it still may be unsafe. Some bacteria, such as Listeria, can grow even on foods stored in the refrigerator if they are  kept for too long. Take precautions when eating out. . At restaurants, avoid buffets and salad bars where food sits out for a long time and comes in contact with many people. Food can become contaminated when someone with a virus, often a norovirus, or another "bug" handles it. . Put any leftover food in a "to-go" container yourself, rather than having the server do it. And, refrigerate  leftovers as soon as you get home. . Choose restaurants that are clean and that are willing to prepare your food as you order it cooked.    MEDICATIONS:                                                                                                                                                          Zofran/Ondansetron 8mg  tablet. Take 1 tablet every 8 hours as needed for nausea/vomiting. (#1 nausea med to take, this can constipate)  Compazine/Prochlorperazine 10mg  tablet. Take 1 tablet every 6 hours as needed for nausea/vomiting. (#2 nausea med to take, this can make you sleepy)  EMLA cream. Apply a quarter size amount to port site 1 hour prior to chemo. Do not rub in. Cover with plastic wrap.   Over-the-Counter Meds: Miralax 1 capful in 8 oz of fluid daily. May increase to two times a day if needed. This is a stool softener. If this doesn't work proceed you can add: Senokot S-start with 1 tablet two times a day and increase to 4 tablets two times a day if needed. (total of 8 tablets in a 24 hour period). This is a stimulant laxative.   Call us if this does not help your bowels move.   Imodium 2mg  capsule. Take 2 capsules after the 1st loose stool and then 1 capsule every 2 hours until you go a total of 12 hours without having a loose stool. Call the [facility] if loose stools continue. If diarrhea occurs @ bedtime, take 2 capsules @ bedtime. Then take 2 capsules every 4 hours until morning. Call [facility].    Nausea Sheet  Zofran/Ondansetron 8mg  tablet. Take 1 tablet every 8 hours as needed for  nausea/vomiting. (#1 nausea med to take, this can constipate)  Compazine/Prochlorperazine 10mg  tablet. Take 1 tablet every 6 hours as needed for nausea/vomiting. (#2 nausea med to take, this can make you sleepy)  You can take these medications together or separately.  We would first like for you to try the Ondansetron by itself and then take the Prochloperizine if needed. But you are allowed to take both medications at the same time if your nausea is that severe.  If you are having persistent nausea (nausea that does not stop) please take these medications on a staggered schedule so that the nausea medication stays in your body.  Please call the [facility] and let us know the amount of nausea that you are experiencing.  If you begin to vomit, you need to call the [facility] and if it is the weekend and you have vomited more than one time and cant get it to stop-go to the Emergency Room.  Persistent nausea/vomiting can lead to dehydration (loss of fluid in your body) and will make you feel terrible.  Ice chips, sips of clear liquids, foods that are @ room temperature, crackers, and toast tend to be better tolerated.     Diarrhea Sheet  If you are having loose stools/diarrhea, please purchase Imodium and begin taking as outlined:  At the first sign of poorly formed or loose stools you should begin taking Imodium(loperamide) 2 mg capsules.  Take two caplets (4mg ) followed by one caplet (2mg ) every 2 hours until you have had no diarrhea for 12 hours.  During the night take two caplets (4mg ) at bedtime and continue every 4 hours during the night until the morning.  Stop taking Imodium only after there is no sign of diarrhea for 12 hours.    Always call the [facility] if you are having loose stools/diarrhea that you can't get under control.  Loose stools/diarrhea leads to dehydration (loss of water) in your body.  We have other options of trying to get the loose stools/diarrhea to stopped but you  must let us know!    Constipation Sheet *Miralax in 8 oz of fluid daily.  May increase to two times a day if needed.  This is a stool softener.  If this not enough to keep your bowel regular:  You can add:  *Senokot S, start with one tablet twice a day and can increase to 4 tablets twice a day if needed.  This is a stimulant laxative.   Sometimes when you take pain medication you need BOTH a medicine to keep your stool soft and a medicine to help your bowel push it out!  Please call if the above does not work for you.   Do not go more than 2 days without a bowel movement.  It is very important that you do not become constipated.  It will make you feel sick to your stomach (nausea) and can cause abdominal pain and vomiting.    SYMPTOMS TO REPORT AS SOON AS POSSIBLE AFTER TREATMENT:  FEVER GREATER THAN 100.5 F  CHILLS WITH OR WITHOUT FEVER  NAUSEA AND VOMITING THAT IS NOT CONTROLLED WITH YOUR NAUSEA MEDICATION  UNUSUAL SHORTNESS OF BREATH  UNUSUAL BRUISING OR BLEEDING  TENDERNESS IN MOUTH AND THROAT WITH OR WITHOUT PRESENCE OF ULCERS  URINARY PROBLEMS  BOWEL PROBLEMS  UNUSUAL RASH      Wear comfortable clothing and clothing appropriate for easy access to any Portacath or PICC line. Let us know if there is anything that we can do to make your therapy better!    What to do if you need assistance after hours or on the weekends: CALL [phone].  HOLD on the line, do not hang up.  You will hear multiple messages but at the end you will be connected with a nurse triage line.  They will contact the doctor if necessary.  Most of the time they will be able to assist you.   Do not call the hospital operator.      I have been informed and understand all of the instructions given to me and have received a copy. I have been instructed to call the clinic [phone] or my family physician as soon as possible for continued medical care, if indicated. I do not have any more  questions at this time but understand that I may call the [facility] or the Patient Navigator at [phone] during office hours should I have questions or need assistance in obtaining follow-up care.

## 2017-09-05 ENCOUNTER — Other Ambulatory Visit: Payer: Self-pay

## 2017-09-05 ENCOUNTER — Inpatient Hospital Stay (HOSPITAL_COMMUNITY): Payer: Medicare Other | Attending: Hematology

## 2017-09-05 ENCOUNTER — Encounter (HOSPITAL_COMMUNITY): Payer: Self-pay

## 2017-09-05 ENCOUNTER — Inpatient Hospital Stay (HOSPITAL_COMMUNITY): Payer: Medicare Other

## 2017-09-05 VITALS — BP 146/79 | HR 106 | Temp 98.2°F | Resp 18 | Ht 60.0 in | Wt 115.8 lb

## 2017-09-05 DIAGNOSIS — R590 Localized enlarged lymph nodes: Secondary | ICD-10-CM | POA: Insufficient documentation

## 2017-09-05 DIAGNOSIS — Z5112 Encounter for antineoplastic immunotherapy: Secondary | ICD-10-CM | POA: Insufficient documentation

## 2017-09-05 DIAGNOSIS — R05 Cough: Secondary | ICD-10-CM | POA: Diagnosis not present

## 2017-09-05 DIAGNOSIS — R634 Abnormal weight loss: Secondary | ICD-10-CM | POA: Insufficient documentation

## 2017-09-05 DIAGNOSIS — Z8709 Personal history of other diseases of the respiratory system: Secondary | ICD-10-CM

## 2017-09-05 DIAGNOSIS — K1230 Oral mucositis (ulcerative), unspecified: Secondary | ICD-10-CM | POA: Diagnosis not present

## 2017-09-05 DIAGNOSIS — C679 Malignant neoplasm of bladder, unspecified: Secondary | ICD-10-CM | POA: Insufficient documentation

## 2017-09-05 DIAGNOSIS — R5383 Other fatigue: Secondary | ICD-10-CM | POA: Diagnosis not present

## 2017-09-05 DIAGNOSIS — Z87898 Personal history of other specified conditions: Secondary | ICD-10-CM

## 2017-09-05 DIAGNOSIS — C7800 Secondary malignant neoplasm of unspecified lung: Secondary | ICD-10-CM | POA: Insufficient documentation

## 2017-09-05 DIAGNOSIS — Z79899 Other long term (current) drug therapy: Secondary | ICD-10-CM | POA: Diagnosis not present

## 2017-09-05 DIAGNOSIS — K219 Gastro-esophageal reflux disease without esophagitis: Secondary | ICD-10-CM | POA: Diagnosis not present

## 2017-09-05 DIAGNOSIS — C787 Secondary malignant neoplasm of liver and intrahepatic bile duct: Secondary | ICD-10-CM | POA: Diagnosis not present

## 2017-09-05 DIAGNOSIS — C689 Malignant neoplasm of urinary organ, unspecified: Secondary | ICD-10-CM

## 2017-09-05 DIAGNOSIS — Z5111 Encounter for antineoplastic chemotherapy: Secondary | ICD-10-CM | POA: Insufficient documentation

## 2017-09-05 DIAGNOSIS — K5900 Constipation, unspecified: Secondary | ICD-10-CM | POA: Insufficient documentation

## 2017-09-05 DIAGNOSIS — I10 Essential (primary) hypertension: Secondary | ICD-10-CM | POA: Diagnosis not present

## 2017-09-05 DIAGNOSIS — R0609 Other forms of dyspnea: Secondary | ICD-10-CM | POA: Diagnosis not present

## 2017-09-05 LAB — CBC WITH DIFFERENTIAL/PLATELET
BASOS ABS: 0 10*3/uL (ref 0.0–0.1)
BASOS PCT: 0 %
EOS PCT: 3 %
Eosinophils Absolute: 0.2 10*3/uL (ref 0.0–0.7)
HCT: 32.8 % — ABNORMAL LOW (ref 36.0–46.0)
Hemoglobin: 9.9 g/dL — ABNORMAL LOW (ref 12.0–15.0)
LYMPHS PCT: 8 %
Lymphs Abs: 0.6 10*3/uL — ABNORMAL LOW (ref 0.7–4.0)
MCH: 29.5 pg (ref 26.0–34.0)
MCHC: 30.2 g/dL (ref 30.0–36.0)
MCV: 97.6 fL (ref 78.0–100.0)
MONO ABS: 0.6 10*3/uL (ref 0.1–1.0)
Monocytes Relative: 7 %
Neutro Abs: 6.2 10*3/uL (ref 1.7–7.7)
Neutrophils Relative %: 82 %
PLATELETS: 272 10*3/uL (ref 150–400)
RBC: 3.36 MIL/uL — AB (ref 3.87–5.11)
RDW: 14.8 % (ref 11.5–15.5)
WBC: 7.6 10*3/uL (ref 4.0–10.5)

## 2017-09-05 LAB — COMPREHENSIVE METABOLIC PANEL
ALBUMIN: 3.2 g/dL — AB (ref 3.5–5.0)
ALT: 5 U/L — ABNORMAL LOW (ref 14–54)
ANION GAP: 12 (ref 5–15)
AST: 10 U/L — AB (ref 15–41)
Alkaline Phosphatase: 95 U/L (ref 38–126)
BILIRUBIN TOTAL: 0.8 mg/dL (ref 0.3–1.2)
BUN: 9 mg/dL (ref 6–20)
CALCIUM: 9.2 mg/dL (ref 8.9–10.3)
CO2: 25 mmol/L (ref 22–32)
Chloride: 100 mmol/L — ABNORMAL LOW (ref 101–111)
Creatinine, Ser: 0.91 mg/dL (ref 0.44–1.00)
GFR calc Af Amer: >60 mL/min (ref >60)
GLUCOSE: 105 mg/dL — AB (ref 65–99)
POTASSIUM: 4.1 mmol/L (ref 3.5–5.1)
Sodium: 137 mmol/L (ref 135–145)
TOTAL PROTEIN: 7.1 g/dL (ref 6.5–8.1)

## 2017-09-05 MED ORDER — SODIUM CHLORIDE 0.9 % IV SOLN
75.0000 mg/m2 | Freq: Once | INTRAVENOUS | Status: AC
  Administered 2017-09-05: 110 mg via INTRAVENOUS
  Filled 2017-09-05: qty 11

## 2017-09-05 MED ORDER — ACETAMINOPHEN 325 MG PO TABS
650.0000 mg | ORAL_TABLET | Freq: Once | ORAL | Status: AC
  Administered 2017-09-05: 650 mg via ORAL

## 2017-09-05 MED ORDER — SODIUM CHLORIDE 0.9 % IV SOLN
10.0000 mg/kg | Freq: Once | INTRAVENOUS | Status: AC
  Administered 2017-09-05: 500 mg via INTRAVENOUS
  Filled 2017-09-05: qty 50

## 2017-09-05 MED ORDER — PEGFILGRASTIM 6 MG/0.6ML ~~LOC~~ PSKT
6.0000 mg | PREFILLED_SYRINGE | Freq: Once | SUBCUTANEOUS | Status: AC
  Administered 2017-09-05: 6 mg via SUBCUTANEOUS
  Filled 2017-09-05: qty 0.6

## 2017-09-05 MED ORDER — SODIUM CHLORIDE 0.9 % IV SOLN
Freq: Once | INTRAVENOUS | Status: AC
  Administered 2017-09-05: 10:00:00 via INTRAVENOUS

## 2017-09-05 MED ORDER — HEPARIN SOD (PORK) LOCK FLUSH 100 UNIT/ML IV SOLN
500.0000 [IU] | Freq: Once | INTRAVENOUS | Status: AC | PRN
  Administered 2017-09-05: 500 [IU]
  Filled 2017-09-05: qty 5

## 2017-09-05 MED ORDER — FAMOTIDINE IN NACL 20-0.9 MG/50ML-% IV SOLN
INTRAVENOUS | Status: AC
  Filled 2017-09-05: qty 50

## 2017-09-05 MED ORDER — HYDROCOD POLST-CPM POLST ER 10-8 MG/5ML PO SUER
5.0000 mL | Freq: Once | ORAL | Status: AC
  Administered 2017-09-05: 5 mL via ORAL
  Filled 2017-09-05: qty 5

## 2017-09-05 MED ORDER — DIPHENHYDRAMINE HCL 50 MG/ML IJ SOLN
INTRAMUSCULAR | Status: AC
  Filled 2017-09-05: qty 1

## 2017-09-05 MED ORDER — HEPARIN SOD (PORK) LOCK FLUSH 100 UNIT/ML IV SOLN
INTRAVENOUS | Status: AC
  Filled 2017-09-05: qty 5

## 2017-09-05 MED ORDER — FAMOTIDINE IN NACL 20-0.9 MG/50ML-% IV SOLN
20.0000 mg | Freq: Two times a day (BID) | INTRAVENOUS | Status: DC
  Administered 2017-09-05: 20 mg via INTRAVENOUS

## 2017-09-05 MED ORDER — ACETAMINOPHEN 325 MG PO TABS
ORAL_TABLET | ORAL | Status: AC
  Filled 2017-09-05: qty 2

## 2017-09-05 MED ORDER — DIPHENHYDRAMINE HCL 50 MG/ML IJ SOLN
50.0000 mg | Freq: Once | INTRAMUSCULAR | Status: AC
  Administered 2017-09-05: 50 mg via INTRAVENOUS

## 2017-09-05 MED ORDER — SODIUM CHLORIDE 0.9 % IV SOLN
Freq: Once | INTRAVENOUS | Status: AC
  Administered 2017-09-05: 11:00:00 via INTRAVENOUS
  Filled 2017-09-05: qty 4

## 2017-09-05 NOTE — Progress Notes (Signed)
Tolerated infusions w/o adverse reaction.  Alert, in no distress.  VSS.  Discharged ambulatory in c/o daughter.

## 2017-09-06 ENCOUNTER — Other Ambulatory Visit (HOSPITAL_COMMUNITY): Payer: Self-pay | Admitting: Hematology

## 2017-09-06 ENCOUNTER — Encounter (HOSPITAL_COMMUNITY): Payer: Self-pay

## 2017-09-06 ENCOUNTER — Inpatient Hospital Stay (HOSPITAL_COMMUNITY): Payer: Medicare Other

## 2017-09-06 VITALS — BP 140/85 | HR 100 | Temp 98.4°F | Resp 18

## 2017-09-06 DIAGNOSIS — Z5111 Encounter for antineoplastic chemotherapy: Secondary | ICD-10-CM | POA: Diagnosis not present

## 2017-09-06 DIAGNOSIS — C7800 Secondary malignant neoplasm of unspecified lung: Principal | ICD-10-CM

## 2017-09-06 DIAGNOSIS — C689 Malignant neoplasm of urinary organ, unspecified: Secondary | ICD-10-CM

## 2017-09-06 DIAGNOSIS — C679 Malignant neoplasm of bladder, unspecified: Secondary | ICD-10-CM

## 2017-09-06 MED ORDER — PEGFILGRASTIM INJECTION 6 MG/0.6ML ~~LOC~~
6.0000 mg | PREFILLED_SYRINGE | Freq: Once | SUBCUTANEOUS | Status: AC
  Administered 2017-09-06: 6 mg via SUBCUTANEOUS

## 2017-09-06 MED ORDER — DEXAMETHASONE 4 MG PO TABS: 4.0000 mg | ORAL_TABLET | Freq: Two times a day (BID) | ORAL | 1 refills | Status: AC

## 2017-09-06 MED ORDER — PEGFILGRASTIM INJECTION 6 MG/0.6ML ~~LOC~~
PREFILLED_SYRINGE | SUBCUTANEOUS | Status: AC
Start: 2017-09-06 — End: 2017-09-06
  Filled 2017-09-06: qty 0.6

## 2017-09-06 NOTE — Patient Instructions (Signed)
[place] at [facility]  Discharge Instructions:  You received a neulasta shot today.  _______________________________________________________________  Thank you for choosing [place] at [facility] to provide your oncology and hematology care.  To afford each patient quality time with our providers, please arrive at least 15 minutes before your scheduled appointment.  You need to re-schedule your appointment if you arrive 10 or more minutes late.  We strive to give you quality time with our providers, and arriving late affects you and other patients whose appointments are after yours.  Also, if you no show three or more times for appointments you may be dismissed from the clinic.  Again, thank you for choosing [place] at [place] hope is that these requests will allow you access to exceptional care and in a timely manner. _______________________________________________________________  If you have questions after your visit, please contact our office at [phone] between the hours of 8:30 a.m. and 5:00 p.m. Voicemails left after 4:30 p.m. will not be returned until the following business day. _______________________________________________________________  For prescription refill requests, have your pharmacy contact our office. _______________________________________________________________  Recommendations made by the consultant and any test results will be sent to your referring physician. _______________________________________________________________

## 2017-09-06 NOTE — Progress Notes (Signed)
Patient called stating the sticky dressing was loose from the Neulasta Onpro with the cannula out of the skin.    Patient in for Neulasta shot.  Neulasta Onpro dressing was loose with the cannula out of the skin.  Patient stated no fevers or chills but having rash with prn itching.  Dr. Delton Coombes saw the patient in the treatment area and will order decadron by mouth as directed.  Patient verbalized understanding.  Patient tolerated neulasta shot with no complaints of pain.  Injection site clean and dry with no bruising or swelling noted at site.  Band aid applied.  Patient left ambulatory with daughter with no s/s of distress noted.

## 2017-09-13 ENCOUNTER — Other Ambulatory Visit (HOSPITAL_COMMUNITY): Payer: Self-pay

## 2017-09-13 ENCOUNTER — Encounter (HOSPITAL_COMMUNITY): Payer: Self-pay | Admitting: Adult Health

## 2017-09-13 ENCOUNTER — Telehealth (HOSPITAL_COMMUNITY): Payer: Self-pay

## 2017-09-13 MED ORDER — MISC. DEVICES MISC: 15.0000 mL | 2 refills | Status: DC | PRN

## 2017-09-13 NOTE — Progress Notes (Signed)
Received call from Maudie Mercury at [facility] re: the blocks/slides received from [place], [place] for Ms. Jodi [name].  Dr. Lyndon Code reviewed specimens; unfortunately, there is insufficient tissue for Foundation One testing.  Maudie Mercury would like to know if there is any additional testing that Dr. Delton Coombes would like before she returns the specimens back to [facility].    Discussed with Dr. Delton Coombes.  He will plan to order Guardant 360 blood testing rather than pursuing additional tissue testing at this time.  I let Maudie Mercury know that it was fine to return tissue to [facility] at this time.    Mike Craze, NP [place] [phone]

## 2017-09-13 NOTE — Telephone Encounter (Signed)
Daughter called this morning to inform us her mother has mouth sores. No thrush at this time. She is still able to eat and drink at this time. She has been using the lidocaine mouthwash she had. Dr. Worthy Keeler was informed and I called in a mouth wash script for the patient. Daughter called and made aware.

## 2017-09-25 NOTE — Progress Notes (Signed)
Jodi [name] FOLLOW-UP NOTE  Patient Care Team: Jodi Art, MD as PCP - General (Internal Medicine)  CHIEF COMPLAINTS:  Metastatic bladder cancer with lung mets  HISTORY OF PRESENTING ILLNESS:  Jodi [name] 72 y.o. female is here for initial consultation for metastatic bladder cancer with lung mets.  She is a previous patient of Dr. Tomie [name] in [place], [place] who has elected to transfer her care to [facility].    Here today with her husband and daughter. Her husband Jodi [name]) is also a patient of Dr. Tomie [name] who has recently transferred his care to [facility].    Recent CT scans from [place], [place] reviewed. There may have been slight progression of lung lesions at that time. She has been on Alimta; last chemo 07/19/17 Her last lung biopsy was ~2 years ago by Dr. Luciana [name] in [place], [place].   Cough is stable. She is able to do ADLs with rest breaks; this is her baseline.  Her shortness of breath with exertion and her cough have not worsened.  She has occasional productive cough "that is a light color and not a lot."  She has lost ~6 lbs or so in the past 1-2 months.  Appetite has not been very good this week; she has not been taking her appetite stimulant medication (Megace). She does not like to take it often because "it makes me wake up in the middle of the night hungry."   Overall, she feels like her appetite is okay.   Recommended sending off Foundation One, if it has not already been done in the past.  Will change treatment to Taxotere/Cyramza. Will try to get her approved to start therapy soon.     INTERVAL HISTORY:  Jodi [name] 73 y.o. female here for routine follow-up for metastatic bladder cancer.   Due for cycle #2 Docetaxel/Cyramza today.   She had mucositis, which is improved. Mouth sores started about 3 days after treatment; lasted about 1 week.  She had flushing, facial redness, and erythema to her hands which has  also resolved. Denies N&V or diarrhea. She has occasional constipation, "not more than normal."  Denies peripheral neuropathy. No fevers or infections.  Endorses occasional blood in her stools, which is worse when she is constipated. This is not new for her.  She continues to have cough, which is chronic. Occasionally her cough is productive.  She feels like her cough is a bit worse, particularly at night. She is requesting refill of her prescription cough medication.   Her BP and HR are elevated; she was previously on BP meds when she was being treated in [place]; she was on Losartan/HCTZ.  This was discontinued in [place] d/t low blood pressure at that time.    We will start her back on anti-hypertensive medication, since Cyramza can further elevated blood pressure going forward.    There was not enough tissue available from her tissue/blocks from [place], [place] to send for Foundation One. Will collect Guardant 360 blood testing today.     Will plan to dose-reduce chemo today based on side effects.     MEDICAL HISTORY:  Past Medical History:  Diagnosis Date  . Bladder cancer ([place])   . GERD (gastroesophageal reflux disease)   . Hypertension   . Hypomagnesemia   . Renal failure     SURGICAL HISTORY: History reviewed. No pertinent surgical history.  SOCIAL HISTORY: Social History   Socioeconomic History  . Marital status: Married  Spouse name: Not on file  . Number of children: Not on file  . Years of education: Not on file  . Highest education level: Not on file  Occupational History  . Not on file  Social Needs  . Financial resource strain: Not on file  . Food insecurity:    Worry: Not on file    Inability: Not on file  . Transportation needs:    Medical: Not on file    Non-medical: Not on file  Tobacco Use  . Smoking status: Not on file  Substance and Sexual Activity  . Alcohol use: Not on file  . Drug use: Not on file  . Sexual activity: Not on file   Lifestyle  . Physical activity:    Days per week: Not on file    Minutes per session: Not on file  . Stress: Not on file  Relationships  . Social connections:    Talks on phone: Not on file    Gets together: Not on file    Attends religious service: Not on file    Active member of club or organization: Not on file    Attends meetings of clubs or organizations: Not on file    Relationship status: Not on file  . Intimate partner violence:    Fear of current or ex partner: Not on file    Emotionally abused: Not on file    Physically abused: Not on file    Forced sexual activity: Not on file  Other Topics Concern  . Not on file  Social History Narrative  . Not on file    FAMILY HISTORY: History reviewed. No pertinent family history.  ALLERGIES:  is allergic to lortab [hydrocodone-acetaminophen].  MEDICATIONS:  Current Outpatient Medications  Medication Sig Dispense Refill  . ALPRAZolam (XANAX PO) Take by mouth as needed.    Marland Kitchen amLODipine (NORVASC) 5 MG tablet Take 1 tablet (5 mg total) by mouth daily. 30 tablet 6  . cholecalciferol (VITAMIN D) 400 units TABS tablet Take 400 Units by mouth daily.    . Cyanocobalamin (VITAMIN B 12 PO) Take 1 mg by mouth daily.    Marland Kitchen dexamethasone (DECADRON) 4 MG tablet Take 1 tablet (4 mg total) by mouth 2 (two) times daily with a meal. Take for 2-3 days after each chemo treatment 40 tablet 1  . diltiazem (CARDIZEM CD) 240 MG 24 hr capsule 1 TABLET AT THE SAME TIME EACH DAY ONCE A DAY ORALLY  2  . DOCEtaxel (TAXOTERE IV) Inject into the vein.    . folic acid (FOLVITE) 1 MG tablet Take 1 mg by mouth daily.  3  . guaiFENesin-codeine 100-10 MG/5ML syrup TAKE 10 ML BY MOUTH 4 TIMES A DAY AS NEEDED COUGH 473 mL 3  . lansoprazole (PREVACID) 30 MG capsule Take 30 mg by mouth daily at 12 noon.    . magic mouthwash w/lidocaine SOLN SWISH AND SPIT 5MLS 4 TIMES A DAY  1  . magnesium oxide (MAG-OX) 400 MG tablet Take 1 tablet by mouth 3 (three) times daily.   3  . meclizine (ANTIVERT) 25 MG tablet Take 25 mg by mouth 3 (three) times daily as needed for dizziness.    . megestrol (MEGACE) 40 MG/ML suspension Take by mouth 2 (two) times daily.    . Misc. Devices MISC 15 mLs by Does not apply route every 3 (three) hours as needed. Maalox+ Lidocaine mix one to one. 1 tablespoon swish and swallow or swish and spit q 3-4  hours prn for mouth sores. 1 each 2  . ondansetron (ZOFRAN-ODT) 4 MG disintegrating tablet Take 4 mg by mouth 3 (three) times daily.  3  . prochlorperazine (COMPAZINE) 10 MG tablet Take 10 mg by mouth every 6 (six) hours as needed for nausea or vomiting.    . Ramucirumab (CYRAMZA IV) Inject into the vein.    . TRANSDERM-SCOP, 1.5 MG, 1 MG/3DAYS APPLY 1 PATCH EVERY 72 HOURS  6   No current facility-administered medications for this visit.     REVIEW OF SYSTEMS:   Constitutional: Denies fevers, chills or abnormal night sweats. Mild fatigue present. Eyes: Denies blurriness of vision, double vision or watery eyes Ears, nose, mouth, throat, and face: Denies mucositis or sore throat Respiratory: Nonproductive cough is stable.  Dyspnea on exertion. Cardiovascular: Denies palpitation, chest discomfort or lower extremity swelling Gastrointestinal:  Denies heartburn or change in bowel habits. Occassional nausea. Skin: Denies abnormal skin rashes Lymphatics: Denies new lymphadenopathy or easy bruising Neurological:Denies numbness, tingling or new weaknesses Behavioral/Psych: Mood is stable, no new changes  All other systems were reviewed with the patient and are negative.  PHYSICAL EXAMINATION: ECOG PERFORMANCE STATUS: 1 - Symptomatic but completely ambulatory  I have reviewed her vitals. GENERAL:alert, no distress and comfortable SKIN: skin color, texture, turgor are normal, no rashes or significant lesions EYES: normal, conjunctiva are pink and non-injected, sclera clear OROPHARYNX:no exudate, no erythema and lips, buccal mucosa, and  tongue normal  NECK: supple, thyroid normal size, non-tender, without nodularity LYMPH:  no palpable lymphadenopathy in the cervical, axillary or inguinal LUNGS: clear to auscultation and percussion with normal breathing effort HEART: regular rate & rhythm and no murmurs and no lower extremity edema ABDOMEN:abdomen soft, non-tender and normal bowel sounds Musculoskeletal:no cyanosis of digits and no clubbing  PSYCH: alert & oriented x 3 with fluent speech   LABORATORY DATA:  I have reviewed the data as listed Lab Results  Component Value Date   WBC 6.4 09/26/2017   HGB 9.3 (L) 09/26/2017   HCT 31.1 (L) 09/26/2017   MCV 97.2 09/26/2017   PLT 201 09/26/2017     Chemistry      Component Value Date/Time   NA 137 09/26/2017 0956   K 4.4 09/26/2017 0956   CL 102 09/26/2017 0956   CO2 28 09/26/2017 0956   BUN 9 09/26/2017 0956   CREATININE 0.74 09/26/2017 0956      Component Value Date/Time   CALCIUM 8.6 (L) 09/26/2017 0956   ALKPHOS 96 09/26/2017 0956   AST 10 (L) 09/26/2017 0956   ALT 6 (L) 09/26/2017 0956   BILITOT 0.7 09/26/2017 0956       RADIOGRAPHIC STUDIES: I have personally reviewed the radiological images as listed and agreed with the findings in the report. CT scan dated 08/02/2017 shows interval worsening of diffuse bilateral pulmonary metastatic disease presented by solid and somewhat cavitary lung masses.  There has been subtle increase in the largest masses on the right and the right main to lung field and especially in the anterior medial right upper lobe and right lung apex.  ASSESSMENT & PLAN:  Urothelial cancer ([place]) 1.  Metastatic bladder cancer with lung, liver metastasis, mediastinal and abdominal adenopathy: - 6 cycles of gemcitabine and carboplatin from 06/08/2015 through 10/27/2015 - 8 cycles of Atezolizumab from 12/29/2015 through 07/05/2016, progressive disease in the liver, mediastinal and abdominal adenopathy -5 cycles of Abraxane from 08/31/2016 through  12/28/2016, stable disease, discontinued secondary to pneumonitis - Single agent pemetrexed from  02/14/2017 through 07/19/2017, CT scan on 08/02/2017 showing some interval worsening of bilateral pulmonary metastatic disease with possible one new lung lesion. - Stable functional status with the ability to continue doing ADLs and IADLs, stable weight, stable cough - Based on the  results of RANGE trial using docetaxel 75 mg/m and Cyramza 10 mg/kg every 21-days which showed superior progression free survival over single agent chemotherapy., we have started on this regimen with C1 on 09/05/2017.  She has developed mucositis and had to use Magic mouthwash.  This happened 3 days after chemo and lasted for 1 week.  She also developed a mild skin rash which improved with dexamethasone.  Hence I would cut back on the dose of docetaxel to 60 mg/m2.  Recently a FGFR3 inhibitor, Erdafitinib was FDA approved for metastatic bladder cancer.  We have tried sending her tissue for foundation one.  However tumor cells were inadequate in the biopsy sample.  Hence we have sent her blood for guardant 360 today.  2.  Nonproductive cough: She has cough predominantly at nighttime, tumor related.  She uses Cheratussin as needed.  3.  Weight loss: She will continue using Megace as needed.  Her weight has been stable lately.  4.  Hypertension: Today her blood pressure is 158/98.  This is Cyramza induced.  I have given a prescription for Norvasc 5 mg daily.  We will monitor her urine protein intermittently.  No orders of the defined types were placed in this encounter.   All questions were answered. The patient knows to call the clinic with any problems, questions or concerns.     Orders placed this encounter:  No orders of the defined types were placed in this encounter.    This note includes documentation from Mike Craze, NP, who was present during this patient's office visit and evaluation.  I have reviewed this note  for its completeness and accuracy.  I have edited this note accordingly based on my findings and medical opinion.     Derek Jack, MD 09/26/2017 8:15 PM

## 2017-09-26 ENCOUNTER — Other Ambulatory Visit (HOSPITAL_COMMUNITY): Payer: Managed Care, Other (non HMO)

## 2017-09-26 ENCOUNTER — Inpatient Hospital Stay (HOSPITAL_BASED_OUTPATIENT_CLINIC_OR_DEPARTMENT_OTHER): Payer: Medicare Other | Admitting: Hematology

## 2017-09-26 ENCOUNTER — Other Ambulatory Visit: Payer: Self-pay

## 2017-09-26 ENCOUNTER — Inpatient Hospital Stay (HOSPITAL_COMMUNITY): Payer: Medicare Other

## 2017-09-26 ENCOUNTER — Encounter (HOSPITAL_COMMUNITY): Payer: Self-pay

## 2017-09-26 ENCOUNTER — Encounter (HOSPITAL_COMMUNITY): Payer: Self-pay | Admitting: Hematology

## 2017-09-26 VITALS — BP 149/87 | HR 108 | Temp 98.6°F | Resp 16 | Wt 116.0 lb

## 2017-09-26 DIAGNOSIS — K5900 Constipation, unspecified: Secondary | ICD-10-CM | POA: Diagnosis not present

## 2017-09-26 DIAGNOSIS — C689 Malignant neoplasm of urinary organ, unspecified: Secondary | ICD-10-CM

## 2017-09-26 DIAGNOSIS — I10 Essential (primary) hypertension: Secondary | ICD-10-CM | POA: Diagnosis not present

## 2017-09-26 DIAGNOSIS — R634 Abnormal weight loss: Secondary | ICD-10-CM

## 2017-09-26 DIAGNOSIS — I159 Secondary hypertension, unspecified: Secondary | ICD-10-CM

## 2017-09-26 DIAGNOSIS — Z79899 Other long term (current) drug therapy: Secondary | ICD-10-CM

## 2017-09-26 DIAGNOSIS — K1230 Oral mucositis (ulcerative), unspecified: Secondary | ICD-10-CM

## 2017-09-26 DIAGNOSIS — C787 Secondary malignant neoplasm of liver and intrahepatic bile duct: Secondary | ICD-10-CM

## 2017-09-26 DIAGNOSIS — R05 Cough: Secondary | ICD-10-CM | POA: Diagnosis not present

## 2017-09-26 DIAGNOSIS — C679 Malignant neoplasm of bladder, unspecified: Secondary | ICD-10-CM

## 2017-09-26 DIAGNOSIS — R590 Localized enlarged lymph nodes: Secondary | ICD-10-CM

## 2017-09-26 DIAGNOSIS — R5383 Other fatigue: Secondary | ICD-10-CM

## 2017-09-26 DIAGNOSIS — R0609 Other forms of dyspnea: Secondary | ICD-10-CM

## 2017-09-26 DIAGNOSIS — K219 Gastro-esophageal reflux disease without esophagitis: Secondary | ICD-10-CM

## 2017-09-26 DIAGNOSIS — C7800 Secondary malignant neoplasm of unspecified lung: Secondary | ICD-10-CM | POA: Diagnosis not present

## 2017-09-26 DIAGNOSIS — Z5111 Encounter for antineoplastic chemotherapy: Secondary | ICD-10-CM

## 2017-09-26 LAB — CBC WITH DIFFERENTIAL/PLATELET
BASOS ABS: 0 10*3/uL (ref 0.0–0.1)
Basophils Relative: 0 %
EOS ABS: 0.4 10*3/uL (ref 0.0–0.7)
Eosinophils Relative: 6 %
HCT: 31.1 % — ABNORMAL LOW (ref 36.0–46.0)
Hemoglobin: 9.3 g/dL — ABNORMAL LOW (ref 12.0–15.0)
LYMPHS ABS: 0.5 10*3/uL — AB (ref 0.7–4.0)
Lymphocytes Relative: 8 %
MCH: 29.1 pg (ref 26.0–34.0)
MCHC: 29.9 g/dL — ABNORMAL LOW (ref 30.0–36.0)
MCV: 97.2 fL (ref 78.0–100.0)
MONO ABS: 0.5 10*3/uL (ref 0.1–1.0)
MONOS PCT: 8 %
NEUTROS ABS: 5 10*3/uL (ref 1.7–7.7)
Neutrophils Relative %: 78 %
Platelets: 201 10*3/uL (ref 150–400)
RBC: 3.2 MIL/uL — AB (ref 3.87–5.11)
RDW: 16.6 % — AB (ref 11.5–15.5)
WBC: 6.4 10*3/uL (ref 4.0–10.5)

## 2017-09-26 LAB — COMPREHENSIVE METABOLIC PANEL
ALK PHOS: 96 U/L (ref 38–126)
ALT: 6 U/L — ABNORMAL LOW (ref 14–54)
AST: 10 U/L — ABNORMAL LOW (ref 15–41)
Albumin: 2.7 g/dL — ABNORMAL LOW (ref 3.5–5.0)
Anion gap: 7 (ref 5–15)
BILIRUBIN TOTAL: 0.7 mg/dL (ref 0.3–1.2)
BUN: 9 mg/dL (ref 6–20)
CALCIUM: 8.6 mg/dL — AB (ref 8.9–10.3)
CO2: 28 mmol/L (ref 22–32)
Chloride: 102 mmol/L (ref 101–111)
Creatinine, Ser: 0.74 mg/dL (ref 0.44–1.00)
GFR calc Af Amer: >60 mL/min (ref >60)
Glucose, Bld: 102 mg/dL — ABNORMAL HIGH (ref 65–99)
POTASSIUM: 4.4 mmol/L (ref 3.5–5.1)
Sodium: 137 mmol/L (ref 135–145)
TOTAL PROTEIN: 6.6 g/dL (ref 6.5–8.1)

## 2017-09-26 MED ORDER — SODIUM CHLORIDE 0.9% FLUSH
10.0000 mL | INTRAVENOUS | Status: DC | PRN
  Administered 2017-09-26: 10 mL
  Filled 2017-09-26: qty 10

## 2017-09-26 MED ORDER — AMLODIPINE BESYLATE 5 MG PO TABS: 5.0000 mg | ORAL_TABLET | Freq: Every day | ORAL | 6 refills | Status: AC

## 2017-09-26 MED ORDER — SODIUM CHLORIDE 0.9 % IV SOLN
Freq: Once | INTRAVENOUS | Status: AC
  Administered 2017-09-26: 12:00:00 via INTRAVENOUS

## 2017-09-26 MED ORDER — GUAIFENESIN-CODEINE 100-10 MG/5ML PO SOLN: ORAL | 3 refills | Status: AC

## 2017-09-26 MED ORDER — DIPHENHYDRAMINE HCL 50 MG/ML IJ SOLN
50.0000 mg | Freq: Once | INTRAMUSCULAR | Status: AC
  Administered 2017-09-26: 50 mg via INTRAVENOUS
  Filled 2017-09-26: qty 1

## 2017-09-26 MED ORDER — PEGFILGRASTIM 6 MG/0.6ML ~~LOC~~ PSKT
6.0000 mg | PREFILLED_SYRINGE | Freq: Once | SUBCUTANEOUS | Status: AC
  Administered 2017-09-26: 6 mg via SUBCUTANEOUS
  Filled 2017-09-26: qty 0.6

## 2017-09-26 MED ORDER — ACETAMINOPHEN 325 MG PO TABS
650.0000 mg | ORAL_TABLET | Freq: Once | ORAL | Status: AC
  Administered 2017-09-26: 650 mg via ORAL
  Filled 2017-09-26: qty 2

## 2017-09-26 MED ORDER — SODIUM CHLORIDE 0.9 % IV SOLN
Freq: Once | INTRAVENOUS | Status: AC
  Administered 2017-09-26: 12:00:00 via INTRAVENOUS
  Filled 2017-09-26: qty 4

## 2017-09-26 MED ORDER — FAMOTIDINE IN NACL 20-0.9 MG/50ML-% IV SOLN
20.0000 mg | Freq: Two times a day (BID) | INTRAVENOUS | Status: DC
  Administered 2017-09-26: 20 mg via INTRAVENOUS
  Filled 2017-09-26: qty 50

## 2017-09-26 MED ORDER — HEPARIN SOD (PORK) LOCK FLUSH 100 UNIT/ML IV SOLN
500.0000 [IU] | Freq: Once | INTRAVENOUS | Status: AC | PRN
  Administered 2017-09-26: 500 [IU]
  Filled 2017-09-26: qty 5

## 2017-09-26 MED ORDER — SODIUM CHLORIDE 0.9 % IV SOLN
60.0000 mg/m2 | Freq: Once | INTRAVENOUS | Status: AC
  Administered 2017-09-26: 90 mg via INTRAVENOUS
  Filled 2017-09-26: qty 9

## 2017-09-26 MED ORDER — SODIUM CHLORIDE 0.9 % IV SOLN
10.0000 mg/kg | Freq: Once | INTRAVENOUS | Status: AC
  Administered 2017-09-26: 500 mg via INTRAVENOUS
  Filled 2017-09-26: qty 50

## 2017-09-26 NOTE — Patient Instructions (Signed)
[facility] Discharge Instructions for Patients Receiving Chemotherapy   Beginning January 23rd 2017 lab work for the Cancer Center will be done in the  Main lab at [facility] on 1st floor. If you have a lab appointment with the Cancer Center please come in thru the  Main Entrance and check in at the main information desk   Today you received the following chemotherapy agents   To help prevent nausea and vomiting after your treatment, we encourage you to take your nausea medication     If you develop nausea and vomiting, or diarrhea that is not controlled by your medication, call the clinic.  The clinic phone number is [phone]. Office hours are Monday-Friday 8:30am-5:00pm.  BELOW ARE SYMPTOMS THAT SHOULD BE REPORTED IMMEDIATELY:  *FEVER GREATER THAN 101.0 F  *CHILLS WITH OR WITHOUT FEVER  NAUSEA AND VOMITING THAT IS NOT CONTROLLED WITH YOUR NAUSEA MEDICATION  *UNUSUAL SHORTNESS OF BREATH  *UNUSUAL BRUISING OR BLEEDING  TENDERNESS IN MOUTH AND THROAT WITH OR WITHOUT PRESENCE OF ULCERS  *URINARY PROBLEMS  *BOWEL PROBLEMS  UNUSUAL RASH Items with * indicate a potential emergency and should be followed up as soon as possible. If you have an emergency after office hours please contact your primary care physician or go to the nearest emergency department.  Please call the clinic during office hours if you have any questions or concerns.   You may also contact the Patient Navigator at [phone] should you have any questions or need assistance in obtaining follow up care.      Resources For Cancer Patients and their Caregivers ? American Cancer Society: Can assist with transportation, wigs, general needs, runs Look Good Feel Better.        1-888-227-6333 ? Cancer Care: Provides financial assistance, online support groups, medication/co-pay assistance.  1-800-813-HOPE (4673) ? [facility] Assists [place] cancer  patients and their families through emotional , educational and financial support.  336-427-4357 ? [place] DSS Where to apply for food stamps, Medicaid and utility assistance. 336-342-1394 ? [facility]: Transportation to medical appointments. 336-347-2287 ? Social Security Administration: May apply for disability if have a Stage IV cancer. 336-342-7796 1-800-772-1213 ? [place] Aging, Disability and Transit Services: Assists with nutrition, care and transit needs. 336-349-2343         

## 2017-09-26 NOTE — Progress Notes (Signed)
Labs reviewed and patient seen today by Dr. Delton Coombes. Will dose reduce taxotere per MD. Proceed with treatment.

## 2017-09-26 NOTE — Assessment & Plan Note (Signed)
1.  Metastatic bladder cancer with lung, liver metastasis, mediastinal and abdominal adenopathy: - 6 cycles of gemcitabine and carboplatin from 06/08/2015 through 10/27/2015 - 8 cycles of Atezolizumab from 12/29/2015 through 07/05/2016, progressive disease in the liver, mediastinal and abdominal adenopathy -5 cycles of Abraxane from 08/31/2016 through 12/28/2016, stable disease, discontinued secondary to pneumonitis - Single agent pemetrexed from 02/14/2017 through 07/19/2017, CT scan on 08/02/2017 showing some interval worsening of bilateral pulmonary metastatic disease with possible one new lung lesion. - Stable functional status with the ability to continue doing ADLs and IADLs, stable weight, stable cough - Based on the  results of RANGE trial using docetaxel 75 mg/m and Cyramza 10 mg/kg every 21-days which showed superior progression free survival over single agent chemotherapy., we have started on this regimen with C1 on 09/05/2017.  She has developed mucositis and had to use Magic mouthwash.  This happened 3 days after chemo and lasted for 1 week.  She also developed a mild skin rash which improved with dexamethasone.  Hence I would cut back on the dose of docetaxel to 60 mg/m2.  Recently a FGFR3 inhibitor, Erdafitinib was FDA approved for metastatic bladder cancer.  We have tried sending her tissue for foundation one.  However tumor cells were inadequate in the biopsy sample.  Hence we have sent her blood for guardant 360 today.  2.  Nonproductive cough: She has cough predominantly at nighttime, tumor related.  She uses Cheratussin as needed.  3.  Weight loss: She will continue using Megace as needed.  Her weight has been stable lately.  4.  Hypertension: Today her blood pressure is 158/98.  This is Cyramza induced.  I have given a prescription for Norvasc 5 mg daily.  We will monitor her urine protein intermittently.

## 2017-10-17 ENCOUNTER — Inpatient Hospital Stay (HOSPITAL_COMMUNITY): Payer: Medicare Other | Attending: Hematology

## 2017-10-17 ENCOUNTER — Other Ambulatory Visit: Payer: Self-pay

## 2017-10-17 ENCOUNTER — Encounter (HOSPITAL_COMMUNITY): Payer: Self-pay | Admitting: Hematology

## 2017-10-17 ENCOUNTER — Other Ambulatory Visit (HOSPITAL_COMMUNITY): Payer: Self-pay

## 2017-10-17 ENCOUNTER — Inpatient Hospital Stay (HOSPITAL_BASED_OUTPATIENT_CLINIC_OR_DEPARTMENT_OTHER): Payer: Medicare Other | Admitting: Hematology

## 2017-10-17 ENCOUNTER — Inpatient Hospital Stay (HOSPITAL_COMMUNITY): Payer: Medicare Other

## 2017-10-17 VITALS — BP 126/75 | HR 97 | Temp 98.3°F | Resp 20

## 2017-10-17 VITALS — BP 132/84 | HR 120 | Temp 99.2°F | Resp 20 | Wt 116.0 lb

## 2017-10-17 DIAGNOSIS — K219 Gastro-esophageal reflux disease without esophagitis: Secondary | ICD-10-CM | POA: Insufficient documentation

## 2017-10-17 DIAGNOSIS — C7800 Secondary malignant neoplasm of unspecified lung: Principal | ICD-10-CM

## 2017-10-17 DIAGNOSIS — R05 Cough: Secondary | ICD-10-CM | POA: Diagnosis not present

## 2017-10-17 DIAGNOSIS — Z5112 Encounter for antineoplastic immunotherapy: Secondary | ICD-10-CM | POA: Diagnosis not present

## 2017-10-17 DIAGNOSIS — R42 Dizziness and giddiness: Secondary | ICD-10-CM

## 2017-10-17 DIAGNOSIS — Z5111 Encounter for antineoplastic chemotherapy: Secondary | ICD-10-CM

## 2017-10-17 DIAGNOSIS — C787 Secondary malignant neoplasm of liver and intrahepatic bile duct: Secondary | ICD-10-CM | POA: Insufficient documentation

## 2017-10-17 DIAGNOSIS — R0602 Shortness of breath: Secondary | ICD-10-CM

## 2017-10-17 DIAGNOSIS — R634 Abnormal weight loss: Secondary | ICD-10-CM | POA: Insufficient documentation

## 2017-10-17 DIAGNOSIS — C689 Malignant neoplasm of urinary organ, unspecified: Secondary | ICD-10-CM

## 2017-10-17 DIAGNOSIS — C679 Malignant neoplasm of bladder, unspecified: Secondary | ICD-10-CM

## 2017-10-17 DIAGNOSIS — K1379 Other lesions of oral mucosa: Secondary | ICD-10-CM

## 2017-10-17 DIAGNOSIS — Z79899 Other long term (current) drug therapy: Secondary | ICD-10-CM | POA: Diagnosis not present

## 2017-10-17 DIAGNOSIS — R5383 Other fatigue: Secondary | ICD-10-CM | POA: Diagnosis not present

## 2017-10-17 DIAGNOSIS — Z7689 Persons encountering health services in other specified circumstances: Secondary | ICD-10-CM | POA: Insufficient documentation

## 2017-10-17 DIAGNOSIS — R590 Localized enlarged lymph nodes: Secondary | ICD-10-CM | POA: Diagnosis not present

## 2017-10-17 DIAGNOSIS — I10 Essential (primary) hypertension: Secondary | ICD-10-CM | POA: Insufficient documentation

## 2017-10-17 DIAGNOSIS — D649 Anemia, unspecified: Secondary | ICD-10-CM

## 2017-10-17 DIAGNOSIS — N3000 Acute cystitis without hematuria: Secondary | ICD-10-CM

## 2017-10-17 LAB — COMPREHENSIVE METABOLIC PANEL
ALT: 6 U/L — AB (ref 14–54)
ANION GAP: 8 (ref 5–15)
AST: 12 U/L — ABNORMAL LOW (ref 15–41)
Albumin: 2.9 g/dL — ABNORMAL LOW (ref 3.5–5.0)
Alkaline Phosphatase: 93 U/L (ref 38–126)
BUN: 7 mg/dL (ref 6–20)
CHLORIDE: 104 mmol/L (ref 101–111)
CO2: 28 mmol/L (ref 22–32)
CREATININE: 0.81 mg/dL (ref 0.44–1.00)
Calcium: 8.8 mg/dL — ABNORMAL LOW (ref 8.9–10.3)
Glucose, Bld: 113 mg/dL — ABNORMAL HIGH (ref 65–99)
POTASSIUM: 3.9 mmol/L (ref 3.5–5.1)
SODIUM: 140 mmol/L (ref 135–145)
Total Bilirubin: 0.7 mg/dL (ref 0.3–1.2)
Total Protein: 6.2 g/dL — ABNORMAL LOW (ref 6.5–8.1)

## 2017-10-17 LAB — CBC WITH DIFFERENTIAL/PLATELET
Basophils Absolute: 0 10*3/uL (ref 0.0–0.1)
Basophils Relative: 0 %
EOS ABS: 0.1 10*3/uL (ref 0.0–0.7)
Eosinophils Relative: 2 %
HCT: 32.4 % — ABNORMAL LOW (ref 36.0–46.0)
Hemoglobin: 9.8 g/dL — ABNORMAL LOW (ref 12.0–15.0)
LYMPHS ABS: 0.5 10*3/uL — AB (ref 0.7–4.0)
LYMPHS PCT: 6 %
MCH: 30.2 pg (ref 26.0–34.0)
MCHC: 30.2 g/dL (ref 30.0–36.0)
MCV: 100 fL (ref 78.0–100.0)
MONO ABS: 0.7 10*3/uL (ref 0.1–1.0)
Monocytes Relative: 8 %
Neutro Abs: 6.7 10*3/uL (ref 1.7–7.7)
Neutrophils Relative %: 84 %
PLATELETS: 215 10*3/uL (ref 150–400)
RBC: 3.24 MIL/uL — AB (ref 3.87–5.11)
RDW: 19.6 % — AB (ref 11.5–15.5)
WBC: 8 10*3/uL (ref 4.0–10.5)

## 2017-10-17 LAB — URINALYSIS, DIPSTICK ONLY
BILIRUBIN URINE: NEGATIVE
Glucose, UA: NEGATIVE mg/dL
Hgb urine dipstick: NEGATIVE
Ketones, ur: NEGATIVE mg/dL
NITRITE: POSITIVE — AB
PH: 5 (ref 5.0–8.0)
Protein, ur: 30 mg/dL — AB
SPECIFIC GRAVITY, URINE: 1.019 (ref 1.005–1.030)

## 2017-10-17 MED ORDER — CIPROFLOXACIN HCL 500 MG PO TABS: 500.0000 mg | ORAL_TABLET | Freq: Two times a day (BID) | ORAL | 0 refills | Status: DC

## 2017-10-17 MED ORDER — SODIUM CHLORIDE 0.9 % IV SOLN
60.0000 mg/m2 | Freq: Once | INTRAVENOUS | Status: AC
  Administered 2017-10-17: 90 mg via INTRAVENOUS
  Filled 2017-10-17: qty 9

## 2017-10-17 MED ORDER — ACETAMINOPHEN 325 MG PO TABS
650.0000 mg | ORAL_TABLET | Freq: Once | ORAL | Status: AC
  Administered 2017-10-17: 650 mg via ORAL

## 2017-10-17 MED ORDER — DIPHENHYDRAMINE HCL 50 MG/ML IJ SOLN
50.0000 mg | Freq: Once | INTRAMUSCULAR | Status: AC
  Administered 2017-10-17: 50 mg via INTRAVENOUS

## 2017-10-17 MED ORDER — HEPARIN SOD (PORK) LOCK FLUSH 100 UNIT/ML IV SOLN
500.0000 [IU] | Freq: Once | INTRAVENOUS | Status: AC | PRN
  Administered 2017-10-17: 500 [IU]
  Filled 2017-10-17: qty 5

## 2017-10-17 MED ORDER — ACETAMINOPHEN 325 MG PO TABS
ORAL_TABLET | ORAL | Status: AC
Start: 2017-10-17 — End: ?
  Filled 2017-10-17: qty 2

## 2017-10-17 MED ORDER — DIPHENHYDRAMINE HCL 50 MG/ML IJ SOLN
INTRAMUSCULAR | Status: AC
  Filled 2017-10-17: qty 1

## 2017-10-17 MED ORDER — SODIUM CHLORIDE 0.9 % IV SOLN
10.0000 mg/kg | Freq: Once | INTRAVENOUS | Status: AC
  Administered 2017-10-17: 500 mg via INTRAVENOUS
  Filled 2017-10-17: qty 50

## 2017-10-17 MED ORDER — MAGIC MOUTHWASH W/LIDOCAINE: ORAL | 2 refills | Status: AC

## 2017-10-17 MED ORDER — PEGFILGRASTIM 6 MG/0.6ML ~~LOC~~ PSKT
6.0000 mg | PREFILLED_SYRINGE | Freq: Once | SUBCUTANEOUS | Status: AC
  Administered 2017-10-17: 6 mg via SUBCUTANEOUS
  Filled 2017-10-17: qty 0.6

## 2017-10-17 MED ORDER — FAMOTIDINE IN NACL 20-0.9 MG/50ML-% IV SOLN
INTRAVENOUS | Status: AC
  Filled 2017-10-17: qty 50

## 2017-10-17 MED ORDER — SODIUM CHLORIDE 0.9% FLUSH
10.0000 mL | INTRAVENOUS | Status: DC | PRN
  Administered 2017-10-17: 10 mL
  Filled 2017-10-17: qty 10

## 2017-10-17 MED ORDER — SODIUM CHLORIDE 0.9 % IV SOLN
Freq: Once | INTRAVENOUS | Status: AC
  Administered 2017-10-17: 11:00:00 via INTRAVENOUS
  Filled 2017-10-17: qty 4

## 2017-10-17 MED ORDER — SODIUM CHLORIDE 0.9 % IV SOLN
Freq: Once | INTRAVENOUS | Status: AC
  Administered 2017-10-17: 11:00:00 via INTRAVENOUS

## 2017-10-17 MED ORDER — FAMOTIDINE IN NACL 20-0.9 MG/50ML-% IV SOLN
20.0000 mg | Freq: Two times a day (BID) | INTRAVENOUS | Status: DC
  Administered 2017-10-17: 20 mg via INTRAVENOUS

## 2017-10-17 NOTE — Assessment & Plan Note (Signed)
1.  Metastatic bladder cancer with lung, liver metastasis, mediastinal and abdominal adenopathy: - 6 cycles of gemcitabine and carboplatin from 06/08/2015 through 10/27/2015 - 8 cycles of Atezolizumab from 12/29/2015 through 07/05/2016, progressive disease in the liver, mediastinal and abdominal adenopathy -5 cycles of Abraxane from 08/31/2016 through 12/28/2016, stable disease, discontinued secondary to pneumonitis - Single agent pemetrexed from 02/14/2017 through 07/19/2017, CT scan on 08/02/2017 showing some interval worsening of bilateral pulmonary metastatic disease with possible one new lung lesion. - Stable functional status with the ability to continue doing ADLs and IADLs, stable weight, stable cough - Based on the  results of RANGE trial using docetaxel 75 mg/m and Cyramza 10 mg/kg every 21-days which showed superior progression free survival over single agent chemotherapy., we have started on this regimen with C1 on 09/05/2017.  She had developed mucositis and severe tiredness after cycle 1. -Cycle 2 on 09/26/2017 with docetaxel dose reduced to 60 mg/m2, tolerated very well.  We will proceed with cycle 3 today.  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  I plan to repeat CT scans after this treatment.  2.  Nonproductive cough: She has cough predominantly at nighttime, tumor related.  She uses Cheratussin as needed.  3.  Weight loss: She will continue using Megace as needed.  Her weight has been stable lately.  4.  Hypertension:  We have started her on Norvasc 5 mg daily since we started Cyramza.  Her blood pressure today is in the normal range. 

## 2017-10-17 NOTE — Progress Notes (Signed)
Patient tolerated chemotherapy with no complaints voiced.  Good blood return noted before and after administration of chemo.  Port site clean and dry with no bruising or swelling noted at site.  Band aid applied.  Neulasta Onpro applied to left arm with green indicator light flashing and no alarms noted.  Band aid applied to port.  VSS with discharge and left ambulatory with no s/s of distress noted.

## 2017-10-17 NOTE — Progress Notes (Signed)
[place], [place] [address]   CLINIC:  Medical Oncology/Hematology  PCP:  Joseph Art, MD [facility] [address] [place] [address] [phone]   REASON FOR VISIT:  Follow-up for metastatic bladder cancer.  CURRENT THERAPY: Docetaxel and Cyramza.  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/p right nephroureterectomy      05/2015 Imaging    Metastatic disease to lungs      06/08/2015 - 10/27/2015 Chemotherapy    6 cycles of Gemcitabine and Carboplatin      12/29/2015 - 07/05/2016 Antibody Plan    8 cycles of Atezolizumab      08/31/2016 - 12/28/2016 Chemotherapy    5 cycles of Abraxane Discontinued due to pnemonitis       02/14/2017 - 07/19/2017 Chemotherapy    Pemetrexed q 3wks        08/02/2017 Progression    CT shows worsening of bilateral pulmonary metastatic disease      09/04/2017 -  Chemotherapy    The patient had pegfilgrastim (NEULASTA ONPRO KIT) injection 6 mg, 6 mg, Subcutaneous, Once, 3 of 6 cycles Administration: 6 mg (09/05/2017), 6 mg (09/26/2017), 6 mg (10/17/2017) DOCEtaxel (TAXOTERE) 110 mg in sodium chloride 0.9 % 250 mL chemo infusion, 75 mg/m2 = 110 mg, Intravenous,  Once, 3 of 6 cycles Dose modification: 60 mg/m2 (80 % of original dose 75 mg/m2, Cycle 2, Reason: Provider Judgment) Administration: 110 mg (09/05/2017), 90 mg (09/26/2017), 90 mg (10/17/2017) ondansetron (ZOFRAN) 8 mg, dexamethasone (DECADRON) 10 mg in sodium chloride 0.9 % 50 mL IVPB, , Intravenous,  Once, 3 of 6 cycles Administration:  (09/05/2017),  (09/26/2017),  (10/17/2017) ramucirumab (CYRAMZA) 500 mg in sodium chloride 0.9 % 200 mL chemo infusion, 10 mg/kg = 500 mg, Intravenous, Once, 3 of 6 cycles Administration: 500 mg (09/05/2017), 500 mg (09/26/2017), 500 mg (10/17/2017)  for chemotherapy treatment.         CANCER  STAGING: Cancer Staging Urothelial cancer [facility]) Staging form: Kidney, AJCC 8th Edition - Clinical stage from 08/30/2017: Stage IV (cT1b, cNX, pM1) - Unsigned    INTERVAL HISTORY:  Ms. Clements 72 y.o. female returns for follow-up and cycle 3 of chemotherapy.  After her last cycle she did relatively well.  She had some mucositis lasting 2 to 3 days.  Denies any major bleeding.  She had mild rectal bleeding when she was constipated for few days.  Occasional dizziness present.  Cough particularly at nighttime is stable.  Shortness of breath on exertion is also stable.  Denies any tingling or numbness next remedies.  Denies any fevers or night sweats.  No hospitalizations.   REVIEW OF SYSTEMS:  Review of Systems  Constitutional: Positive for fatigue.  Respiratory: Positive for cough and shortness of breath.   Gastrointestinal: Positive for nausea.  Neurological: Positive for dizziness.  All other systems reviewed and are negative.    PAST MEDICAL/SURGICAL HISTORY:  Past Medical History:  Diagnosis Date  . Bladder cancer (Early)   . GERD (gastroesophageal reflux disease)   . Hypertension   . Hypomagnesemia   . Renal failure    History reviewed. No pertinent surgical history.   SOCIAL HISTORY:  Social History   Socioeconomic History  . Marital status: Married    Spouse name: Not on file  . Number of children: Not on file  . Years of education: Not  on file  . Highest education level: Not on file  Occupational History  . Not on file  Social Needs  . Financial resource strain: Not on file  . Food insecurity:    Worry: Not on file    Inability: Not on file  . Transportation needs:    Medical: Not on file    Non-medical: Not on file  Tobacco Use  . Smoking status: Not on file  Substance and Sexual Activity  . Alcohol use: Not on file  . Drug use: Not on file  . Sexual activity: Not on file  Lifestyle  . Physical activity:    Days per week: Not on file    Minutes per  session: Not on file  . Stress: Not on file  Relationships  . Social connections:    Talks on phone: Not on file    Gets together: Not on file    Attends religious service: Not on file    Active member of club or organization: Not on file    Attends meetings of clubs or organizations: Not on file    Relationship status: Not on file  . Intimate partner violence:    Fear of current or ex partner: Not on file    Emotionally abused: Not on file    Physically abused: Not on file    Forced sexual activity: Not on file  Other Topics Concern  . Not on file  Social History Narrative  . Not on file    FAMILY HISTORY:  History reviewed. No pertinent family history.  CURRENT MEDICATIONS:  Outpatient Encounter Medications as of 10/17/2017  Medication Sig  . ALPRAZolam (XANAX PO) Take by mouth as needed.  Marland Kitchen amLODipine (NORVASC) 5 MG tablet Take 1 tablet (5 mg total) by mouth daily.  . cholecalciferol (VITAMIN D) 400 units TABS tablet Take 400 Units by mouth daily.  . Cyanocobalamin (VITAMIN B 12 PO) Take 1 mg by mouth daily.  Marland Kitchen dexamethasone (DECADRON) 4 MG tablet Take 1 tablet (4 mg total) by mouth 2 (two) times daily with a meal. Take for 2-3 days after each chemo treatment  . diltiazem (CARDIZEM CD) 240 MG 24 hr capsule 1 TABLET AT THE SAME TIME EACH DAY ONCE A DAY ORALLY  . DOCEtaxel (TAXOTERE IV) Inject into the vein.  . folic acid (FOLVITE) 1 MG tablet Take 1 mg by mouth daily.  Marland Kitchen guaiFENesin-codeine 100-10 MG/5ML syrup TAKE 10 ML BY MOUTH 4 TIMES A DAY AS NEEDED COUGH  . lansoprazole (PREVACID) 30 MG capsule Take 30 mg by mouth daily at 12 noon.  . magnesium oxide (MAG-OX) 400 MG tablet Take 1 tablet by mouth 3 (three) times daily.  . meclizine (ANTIVERT) 25 MG tablet Take 25 mg by mouth 3 (three) times daily as needed for dizziness.  . megestrol (MEGACE) 40 MG/ML suspension Take by mouth 2 (two) times daily.  . Misc. Devices MISC 15 mLs by Does not apply route every 3 (three) hours  as needed. Maalox+ Lidocaine mix one to one. 1 tablespoon swish and swallow or swish and spit q 3-4 hours prn for mouth sores.  . ondansetron (ZOFRAN-ODT) 4 MG disintegrating tablet Take 4 mg by mouth 3 (three) times daily.  . prochlorperazine (COMPAZINE) 10 MG tablet Take 10 mg by mouth every 6 (six) hours as needed for nausea or vomiting.  . Ramucirumab (CYRAMZA IV) Inject into the vein.  . TRANSDERM-SCOP, 1.5 MG, 1 MG/3DAYS APPLY 1 PATCH EVERY 72 HOURS  . [DISCONTINUED]  magic mouthwash w/lidocaine SOLN SWISH AND SPIT 5MLS 4 TIMES A DAY   No facility-administered encounter medications on file as of 10/17/2017.     ALLERGIES:  Allergies  Allergen Reactions  . Lortab [Hydrocodone-Acetaminophen]      PHYSICAL EXAM:  ECOG Performance status: 1  Vitals:   10/17/17 1007  BP: 132/84  Pulse: (!) 120  Resp: 20  Temp: 99.2 F (37.3 C)  SpO2: 92%   Filed Weights   10/17/17 1007  Weight: 116 lb (52.6 kg)    Physical Exam HEENT: No mucositis or thrush. Chest: Bilateral clear to auscultation. CVS: S1-S2 regular rate and rhythm. Abdomen: Soft nontender with no palpable masses. Extremities: No edema or cyanosis.  LABORATORY DATA:  I have reviewed the labs as listed.  CBC    Component Value Date/Time   WBC 8.0 10/17/2017 0921   RBC 3.24 (L) 10/17/2017 0921   HGB 9.8 (L) 10/17/2017 0921   HCT 32.4 (L) 10/17/2017 0921   PLT 215 10/17/2017 0921   MCV 100.0 10/17/2017 0921   MCH 30.2 10/17/2017 0921   MCHC 30.2 10/17/2017 0921   RDW 19.6 (H) 10/17/2017 0921   LYMPHSABS 0.5 (L) 10/17/2017 0921   MONOABS 0.7 10/17/2017 0921   EOSABS 0.1 10/17/2017 0921   BASOSABS 0.0 10/17/2017 0921   CMP Latest Ref Rng & Units 10/17/2017 09/26/2017 09/05/2017  Glucose 65 - 99 mg/dL 113(H) 102(H) 105(H)  BUN 6 - 20 mg/dL 7 9 9   Creatinine 0.44 - 1.00 mg/dL 0.81 0.74 0.91  Sodium 135 - 145 mmol/L 140 137 137  Potassium 3.5 - 5.1 mmol/L 3.9 4.4 4.1  Chloride 101 - 111 mmol/L 104 102 100(L)  CO2  22 - 32 mmol/L 28 28 25   Calcium 8.9 - 10.3 mg/dL 8.8(L) 8.6(L) 9.2  Total Protein 6.5 - 8.1 g/dL 6.2(L) 6.6 7.1  Total Bilirubin 0.3 - 1.2 mg/dL 0.7 0.7 0.8  Alkaline Phos 38 - 126 U/L 93 96 95  AST 15 - 41 U/L 12(L) 10(L) 10(L)  ALT 14 - 54 U/L 6(L) 6(L) 5(L)         ASSESSMENT & PLAN:   Urothelial cancer ([facility]) 1.  Metastatic bladder cancer with lung, liver metastasis, mediastinal and abdominal adenopathy: - 6 cycles of gemcitabine and carboplatin from 06/08/2015 through 10/27/2015 - 8 cycles of Atezolizumab from 12/29/2015 through 07/05/2016, progressive disease in the liver, mediastinal and abdominal adenopathy -5 cycles of Abraxane from 08/31/2016 through 12/28/2016, stable disease, discontinued secondary to pneumonitis - Single agent pemetrexed from 02/14/2017 through 07/19/2017, CT scan on 08/02/2017 showing some interval worsening of bilateral pulmonary metastatic disease with possible one new lung lesion. - Stable functional status with the ability to continue doing ADLs and IADLs, stable weight, stable cough - Based on the  results of RANGE trial using docetaxel 75 mg/m and Cyramza 10 mg/kg every 21-days which showed superior progression free survival over single agent chemotherapy., we have started on this regimen with C1 on 09/05/2017.  She had developed mucositis and severe tiredness after cycle 1. -Cycle 2 on 09/26/2017 with docetaxel dose reduced to 60 mg/m2, tolerated very well.  We will proceed with cycle 3 today.  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  I plan to repeat CT scans after this treatment.  2.  Nonproductive cough: She has cough predominantly at nighttime, tumor related.  She uses Cheratussin as needed.  3.  Weight loss: She will continue using Megace as needed.  Her weight has been stable  lately.  4.  Hypertension:  We have started her on Norvasc 5 mg daily since we started Cyramza.  Her blood pressure today is in the normal range.  We have done UA  to see if she has proteinuria.  This was positive for nitrites and leukocytes.  We will call in Cipro.    Orders placed this encounter:  Orders Placed This Encounter  Procedures  . CT Chest W Contrast  . CT Abdomen Pelvis W Contrast  . Comprehensive metabolic panel  . CBC with Differential  . Vitamin B12  . Folate  . Iron and TIBC  . Ferritin      Derek Jack, MD [facility] [phone]

## 2017-10-17 NOTE — Patient Instructions (Signed)
[facility] Discharge Instructions for Patients Receiving Chemotherapy  Today you received the following chemotherapy agents cyramza and taxotere.   If you develop nausea and vomiting that is not controlled by your nausea medication, call the clinic.   BELOW ARE SYMPTOMS THAT SHOULD BE REPORTED IMMEDIATELY:  *FEVER GREATER THAN 100.5 F  *CHILLS WITH OR WITHOUT FEVER  NAUSEA AND VOMITING THAT IS NOT CONTROLLED WITH YOUR NAUSEA MEDICATION  *UNUSUAL SHORTNESS OF BREATH  *UNUSUAL BRUISING OR BLEEDING  TENDERNESS IN MOUTH AND THROAT WITH OR WITHOUT PRESENCE OF ULCERS  *URINARY PROBLEMS  *BOWEL PROBLEMS  UNUSUAL RASH Items with * indicate a potential emergency and should be followed up as soon as possible.  Feel free to call the clinic should you have any questions or concerns. The clinic phone number is [phone].  Please show the [facility] at check-in to the Emergency Department and triage nurse.

## 2017-10-17 NOTE — Addendum Note (Signed)
Addended by: Derek Jack on: 10/17/2017 05:48 PM   Modules accepted: Orders

## 2017-11-03 ENCOUNTER — Emergency Department (HOSPITAL_COMMUNITY): Payer: Medicare Other

## 2017-11-03 ENCOUNTER — Encounter (HOSPITAL_COMMUNITY): Payer: Self-pay

## 2017-11-03 ENCOUNTER — Other Ambulatory Visit: Payer: Self-pay

## 2017-11-03 ENCOUNTER — Inpatient Hospital Stay (HOSPITAL_COMMUNITY)
Admission: EM | Admit: 2017-11-03 | Discharge: 2017-11-06 | DRG: 181 | Disposition: A | Discharge status: Home Health | Payer: Medicare Other | Attending: Internal Medicine | Admitting: Internal Medicine

## 2017-11-03 DIAGNOSIS — K219 Gastro-esophageal reflux disease without esophagitis: Secondary | ICD-10-CM | POA: Diagnosis present

## 2017-11-03 DIAGNOSIS — C7801 Secondary malignant neoplasm of right lung: Principal | ICD-10-CM | POA: Diagnosis present

## 2017-11-03 DIAGNOSIS — I10 Essential (primary) hypertension: Secondary | ICD-10-CM | POA: Diagnosis not present

## 2017-11-03 DIAGNOSIS — C679 Malignant neoplasm of bladder, unspecified: Secondary | ICD-10-CM

## 2017-11-03 DIAGNOSIS — C689 Malignant neoplasm of urinary organ, unspecified: Secondary | ICD-10-CM | POA: Diagnosis present

## 2017-11-03 DIAGNOSIS — R042 Hemoptysis: Secondary | ICD-10-CM | POA: Diagnosis present

## 2017-11-03 DIAGNOSIS — D649 Anemia, unspecified: Secondary | ICD-10-CM

## 2017-11-03 DIAGNOSIS — T451X5A Adverse effect of antineoplastic and immunosuppressive drugs, initial encounter: Secondary | ICD-10-CM | POA: Diagnosis present

## 2017-11-03 DIAGNOSIS — C7800 Secondary malignant neoplasm of unspecified lung: Secondary | ICD-10-CM | POA: Diagnosis not present

## 2017-11-03 DIAGNOSIS — C799 Secondary malignant neoplasm of unspecified site: Secondary | ICD-10-CM

## 2017-11-03 DIAGNOSIS — R531 Weakness: Secondary | ICD-10-CM | POA: Diagnosis present

## 2017-11-03 DIAGNOSIS — Z79899 Other long term (current) drug therapy: Secondary | ICD-10-CM

## 2017-11-03 DIAGNOSIS — Z905 Acquired absence of kidney: Secondary | ICD-10-CM

## 2017-11-03 DIAGNOSIS — Z515 Encounter for palliative care: Secondary | ICD-10-CM | POA: Diagnosis not present

## 2017-11-03 DIAGNOSIS — D638 Anemia in other chronic diseases classified elsewhere: Secondary | ICD-10-CM | POA: Diagnosis present

## 2017-11-03 DIAGNOSIS — Z885 Allergy status to narcotic agent status: Secondary | ICD-10-CM

## 2017-11-03 DIAGNOSIS — Z7952 Long term (current) use of systemic steroids: Secondary | ICD-10-CM

## 2017-11-03 LAB — CBC WITH DIFFERENTIAL/PLATELET
Basophils Absolute: 0 10*3/uL (ref 0.0–0.1)
Basophils Relative: 0 %
EOS ABS: 0 10*3/uL (ref 0.0–0.7)
EOS PCT: 0 %
HCT: 28.4 % — ABNORMAL LOW (ref 36.0–46.0)
HEMOGLOBIN: 8.8 g/dL — AB (ref 12.0–15.0)
LYMPHS ABS: 0.9 10*3/uL (ref 0.7–4.0)
Lymphocytes Relative: 7 %
MCH: 30.2 pg (ref 26.0–34.0)
MCHC: 31 g/dL (ref 30.0–36.0)
MCV: 97.6 fL (ref 78.0–100.0)
MONO ABS: 0.4 10*3/uL (ref 0.1–1.0)
Monocytes Relative: 3 %
NEUTROS PCT: 90 %
Neutro Abs: 11 10*3/uL — ABNORMAL HIGH (ref 1.7–7.7)
PLATELETS: 167 10*3/uL (ref 150–400)
RBC: 2.91 MIL/uL — ABNORMAL LOW (ref 3.87–5.11)
RDW: 19.3 % — ABNORMAL HIGH (ref 11.5–15.5)
WBC: 12.3 10*3/uL — AB (ref 4.0–10.5)

## 2017-11-03 LAB — COMPREHENSIVE METABOLIC PANEL
ALK PHOS: 124 U/L (ref 38–126)
ALT: 7 U/L (ref 0–44)
AST: 13 U/L — ABNORMAL LOW (ref 15–41)
Albumin: 3.1 g/dL — ABNORMAL LOW (ref 3.5–5.0)
Anion gap: 9 (ref 5–15)
BILIRUBIN TOTAL: 1 mg/dL (ref 0.3–1.2)
BUN: 10 mg/dL (ref 8–23)
CALCIUM: 8.5 mg/dL — AB (ref 8.9–10.3)
CO2: 25 mmol/L (ref 22–32)
CREATININE: 0.75 mg/dL (ref 0.44–1.00)
Chloride: 102 mmol/L (ref 98–111)
GFR calc Af Amer: >60 mL/min (ref >60)
Glucose, Bld: 104 mg/dL — ABNORMAL HIGH (ref 70–99)
Potassium: 4.2 mmol/L (ref 3.5–5.1)
Sodium: 136 mmol/L (ref 135–145)
TOTAL PROTEIN: 6.2 g/dL — AB (ref 6.5–8.1)

## 2017-11-03 LAB — SAMPLE TO BLOOD BANK

## 2017-11-03 LAB — PROTIME-INR
INR: 1.11
Prothrombin Time: 14.2 seconds (ref 11.4–15.2)

## 2017-11-03 MED ORDER — ACETAMINOPHEN 650 MG RE SUPP: 650.0000 mg | Freq: Four times a day (QID) | RECTAL | Status: DC | PRN

## 2017-11-03 MED ORDER — GUAIFENESIN-CODEINE 100-10 MG/5ML PO SOLN
10.0000 mL | ORAL | Status: DC | PRN
  Administered 2017-11-05 (×2): 10 mL via ORAL
  Filled 2017-11-03 (×2): qty 10

## 2017-11-03 MED ORDER — MAGIC MOUTHWASH
5.0000 mL | Freq: Four times a day (QID) | ORAL | Status: DC
  Administered 2017-11-03 – 2017-11-06 (×11): 5 mL via ORAL
  Filled 2017-11-03 (×15): qty 5

## 2017-11-03 MED ORDER — PANTOPRAZOLE SODIUM 40 MG PO TBEC
40.0000 mg | DELAYED_RELEASE_TABLET | Freq: Every day | ORAL | Status: DC
  Administered 2017-11-03 – 2017-11-06 (×4): 40 mg via ORAL
  Filled 2017-11-03 (×4): qty 1

## 2017-11-03 MED ORDER — ACETAMINOPHEN 325 MG PO TABS: 650.0000 mg | ORAL_TABLET | Freq: Four times a day (QID) | ORAL | Status: DC | PRN

## 2017-11-03 MED ORDER — LIDOCAINE VISCOUS HCL 2 % MT SOLN
5.0000 mL | Freq: Four times a day (QID) | OROMUCOSAL | Status: DC
  Administered 2017-11-03 – 2017-11-06 (×11): 5 mL via OROMUCOSAL
  Filled 2017-11-03 (×15): qty 15

## 2017-11-03 MED ORDER — SODIUM CHLORIDE 0.9 % IV SOLN
3.0000 g | Freq: Four times a day (QID) | INTRAVENOUS | Status: DC
  Administered 2017-11-03 – 2017-11-06 (×13): 3 g via INTRAVENOUS
  Filled 2017-11-03 (×20): qty 3

## 2017-11-03 MED ORDER — ALPRAZOLAM 0.25 MG PO TABS
0.2500 mg | ORAL_TABLET | Freq: Two times a day (BID) | ORAL | Status: DC | PRN
  Administered 2017-11-04: 0.25 mg via ORAL
  Filled 2017-11-03: qty 1

## 2017-11-03 MED ORDER — SODIUM CHLORIDE 0.9% FLUSH
3.0000 mL | Freq: Two times a day (BID) | INTRAVENOUS | Status: DC
  Administered 2017-11-05 (×2): 3 mL via INTRAVENOUS

## 2017-11-03 MED ORDER — MECLIZINE HCL 25 MG PO TABS
25.0000 mg | ORAL_TABLET | Freq: Two times a day (BID) | ORAL | Status: DC
  Administered 2017-11-03 – 2017-11-06 (×7): 25 mg via ORAL
  Filled 2017-11-03 (×4): qty 1
  Filled 2017-11-03: qty 2
  Filled 2017-11-03 (×2): qty 1

## 2017-11-03 MED ORDER — DILTIAZEM HCL ER COATED BEADS 240 MG PO CP24
240.0000 mg | ORAL_CAPSULE | Freq: Every day | ORAL | Status: DC
  Administered 2017-11-03 – 2017-11-06 (×4): 240 mg via ORAL
  Filled 2017-11-03 (×4): qty 1

## 2017-11-03 MED ORDER — IOPAMIDOL (ISOVUE-300) INJECTION 61%
75.0000 mL | Freq: Once | INTRAVENOUS | Status: AC | PRN
  Administered 2017-11-03: 75 mL via INTRAVENOUS

## 2017-11-03 MED ORDER — SODIUM CHLORIDE 0.9 % IV SOLN
250.0000 mL | INTRAVENOUS | Status: DC | PRN
  Administered 2017-11-03 – 2017-11-05 (×2): 250 mL via INTRAVENOUS

## 2017-11-03 MED ORDER — PROCHLORPERAZINE MALEATE 10 MG PO TABS: 10.0000 mg | ORAL_TABLET | Freq: Four times a day (QID) | ORAL | Status: DC | PRN

## 2017-11-03 MED ORDER — SODIUM CHLORIDE 0.9% FLUSH: 3.0000 mL | INTRAVENOUS | Status: DC | PRN

## 2017-11-03 MED ORDER — ONDANSETRON 4 MG PO TBDP
4.0000 mg | ORAL_TABLET | Freq: Three times a day (TID) | ORAL | Status: DC
  Administered 2017-11-03 – 2017-11-04 (×2): 4 mg via ORAL
  Filled 2017-11-03 (×3): qty 1

## 2017-11-03 MED ORDER — MAGIC MOUTHWASH W/LIDOCAINE: 5.0000 mL | Freq: Four times a day (QID) | ORAL | Status: DC

## 2017-11-03 MED ORDER — SCOPOLAMINE 1 MG/3DAYS TD PT72
1.0000 | MEDICATED_PATCH | TRANSDERMAL | Status: DC
  Administered 2017-11-05: 1.5 mg via TRANSDERMAL
  Filled 2017-11-03: qty 1

## 2017-11-03 NOTE — ED Provider Notes (Signed)
[facility] EMERGENCY DEPARTMENT Provider Note   CSN: [id number] Arrival date & time: 11/03/17  8676  Time seen 03:05 AM   History   Chief Complaint Chief Complaint  Patient presents with  . Hemoptysis    HPI Jodi [name] is a 72 y.o. female.  HPI patient states that yesterday, June 29 sometime before noon she started noticing when she coughs she was coughing up just blood.  She states it was about a teaspoon at a time.  She states it was about every 15 minutes however she had no coughing or hemoptysis from 4 to 10 PM when she started coughing and coughing up blood again.  She has chronic dyspnea on exertion which is unchanged.  Patient is noted to be pale and her daughter states that typical for her.  She denies feeling dizzy, or chest pain.  She has mild chronic weakness.  She did have some fever after he kicked her chemotherapy on June 13 which is also typical but not in the last several days.  She states she is never done this before.  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  She is on Decadron with her chemotherapy.  PCP Joseph Art, MD  Oncology Dr Delton Coombes   Past Medical History:  Diagnosis Date  . Bladder cancer ([place])   . GERD (gastroesophageal reflux disease)   . Hypertension   . Hypomagnesemia   . Renal failure     Patient Active Problem List   Diagnosis Date Noted  . Goals of care, counseling/discussion 08/31/2017  . Urothelial cancer ([place]) 08/31/2017    Past Surgical History:  Procedure Laterality Date  . ABDOMINAL HYSTERECTOMY    . NEPHROSTOMY      right kidney     OB History   None      Home Medications    Prior to Admission medications   Medication Sig Start Date End Date Taking? Authorizing Provider  ALPRAZolam (XANAX PO) Take by mouth as needed.    [provider]  amLODipine (NORVASC) 5 MG tablet Take 1 tablet (5 mg total) by mouth daily. 09/26/17   Derek Jack, MD  cholecalciferol (VITAMIN D) 400  units TABS tablet Take 400 Units by mouth daily.    [provider]  ciprofloxacin (CIPRO) 500 MG tablet Take 1 tablet (500 mg total) by mouth 2 (two) times daily. 10/17/17   Derek Jack, MD  Cyanocobalamin (VITAMIN B 12 PO) Take 1 mg by mouth daily.    [provider]  dexamethasone (DECADRON) 4 MG tablet Take 1 tablet (4 mg total) by mouth 2 (two) times daily with a meal. Take for 2-3 days after each chemo treatment 09/06/17   Derek Jack, MD  diltiazem (CARDIZEM CD) 240 MG 24 hr capsule 1 TABLET AT THE SAME TIME EACH DAY ONCE A DAY ORALLY 07/24/17   [provider]  DOCEtaxel (TAXOTERE IV) Inject into the vein.    [provider]  folic acid (FOLVITE) 1 MG tablet Take 1 mg by mouth daily. 08/02/17   [provider]  guaiFENesin-codeine 100-10 MG/5ML syrup TAKE 10 ML BY MOUTH 4 TIMES A DAY AS NEEDED COUGH 09/26/17   Derek Jack, MD  lansoprazole (PREVACID) 30 MG capsule Take 30 mg by mouth daily at 12 noon.    [provider]  magic mouthwash w/lidocaine SOLN SWISH AND SPIT 5MLS 4 TIMES A DAY 10/17/17   Derek Jack, MD  magnesium oxide (MAG-OX) 400 MG tablet Take  1 tablet by mouth 3 (three) times daily. 07/31/17   [provider]  meclizine (ANTIVERT) 25 MG tablet Take 25 mg by mouth 3 (three) times daily as needed for dizziness.    [provider]  megestrol (MEGACE) 40 MG/ML suspension Take by mouth 2 (two) times daily.    [provider]  Misc. Devices MISC 15 mLs by Does not apply route every 3 (three) hours as needed. Maalox+ Lidocaine mix one to one. 1 tablespoon swish and swallow or swish and spit q 3-4 hours prn for mouth sores. 09/13/17   Derek Jack, MD  ondansetron (ZOFRAN-ODT) 4 MG disintegrating tablet Take 4 mg by mouth 3 (three) times daily. 06/07/17   [provider]  prochlorperazine (COMPAZINE) 10 MG tablet Take 10 mg by mouth every 6 (six) hours as needed for  nausea or vomiting.    [provider]  Ramucirumab (CYRAMZA IV) Inject into the vein.    [provider]  TRANSDERM-SCOP, 1.5 MG, 1 MG/3DAYS APPLY 1 PATCH EVERY 72 HOURS 08/01/17   [provider]    Family History No family history on file.  Social History Social History   Tobacco Use  . Smoking status: Never Smoker  . Smokeless tobacco: Never Used  Substance Use Topics  . Alcohol use: Not Currently  . Drug use: Not Currently  lives at home   Allergies   Lortab [hydrocodone-acetaminophen]   Review of Systems Review of Systems  All other systems reviewed and are negative.    Physical Exam Updated Vital Signs BP 133/71   Pulse (!) 109   Temp 98.2 F (36.8 C) (Oral)   Resp 20   Ht 5' (1.524 m)   Wt 52.6 kg (116 lb)   SpO2 95%   BMI 22.65 kg/m   Vital signs normal except for tachycardia   Physical Exam  Constitutional: She is oriented to person, place, and time. She appears well-developed and well-nourished.  Non-toxic appearance. She does not appear ill. No distress.  HENT:  Head: Normocephalic and atraumatic.  Right Ear: External ear normal.  Left Ear: External ear normal.  Nose: Nose normal. No mucosal edema or rhinorrhea.  Mouth/Throat: Oropharynx is clear and moist and mucous membranes are normal. No dental abscesses or uvula swelling.  Eyes: Pupils are equal, round, and reactive to light. Conjunctivae and EOM are normal.  Neck: Normal range of motion and full passive range of motion without pain. Neck supple.  Cardiovascular: Normal rate, regular rhythm and normal heart sounds. Exam reveals no gallop and no friction rub.  No murmur heard. Pulmonary/Chest: Effort normal and breath sounds normal. No respiratory distress. She has no wheezes. She has no rhonchi. She has no rales. She exhibits no tenderness and no crepitus.  Abdominal: Soft. Normal appearance and bowel sounds are normal. She exhibits no distension. There is no  tenderness. There is no rebound and no guarding.  Musculoskeletal: Normal range of motion. She exhibits no edema or tenderness.  Moves all extremities well.   Neurological: She is alert and oriented to person, place, and time. She has normal strength. No cranial nerve deficit.  Skin: Skin is warm, dry and intact. No rash noted. No erythema. There is pallor.  Psychiatric: She has a normal mood and affect. Her speech is normal and behavior is normal. Her mood appears not anxious.  Nursing note and vitals reviewed.    ED Treatments / Results  Labs (all labs ordered are listed, but only abnormal results are  displayed) Results for orders placed or performed during the hospital encounter of 11/03/17  Comprehensive metabolic panel  Result Value Ref Range   Sodium 136 135 - 145 mmol/L   Potassium 4.2 3.5 - 5.1 mmol/L   Chloride 102 98 - 111 mmol/L   CO2 25 22 - 32 mmol/L   Glucose, Bld 104 (H) 70 - 99 mg/dL   BUN 10 8 - 23 mg/dL   Creatinine, Ser 0.75 0.44 - 1.00 mg/dL   Calcium 8.5 (L) 8.9 - 10.3 mg/dL   Total Protein 6.2 (L) 6.5 - 8.1 g/dL   Albumin 3.1 (L) 3.5 - 5.0 g/dL   AST 13 (L) 15 - 41 U/L   ALT 7 0 - 44 U/L   Alkaline Phosphatase 124 38 - 126 U/L   Total Bilirubin 1.0 0.3 - 1.2 mg/dL   GFR calc non Af Amer >60 >60 mL/min   GFR calc Af Amer >60 >60 mL/min   Anion gap 9 5 - 15  CBC with Differential  Result Value Ref Range   WBC 12.3 (H) 4.0 - 10.5 K/uL   RBC 2.91 (L) 3.87 - 5.11 MIL/uL   Hemoglobin 8.8 (L) 12.0 - 15.0 g/dL   HCT 28.4 (L) 36.0 - 46.0 %   MCV 97.6 78.0 - 100.0 fL   MCH 30.2 26.0 - 34.0 pg   MCHC 31.0 30.0 - 36.0 g/dL   RDW 19.3 (H) 11.5 - 15.5 %   Platelets 167 150 - 400 K/uL   Neutrophils Relative % 90 %   Lymphocytes Relative 7 %   Monocytes Relative 3 %   Eosinophils Relative 0 %   Basophils Relative 0 %   Neutro Abs 11.0 (H) 1.7 - 7.7 K/uL   Lymphs Abs 0.9 0.7 - 4.0 K/uL   Monocytes Absolute 0.4 0.1 - 1.0 K/uL   Eosinophils Absolute 0.0 0.0 -  0.7 K/uL   Basophils Absolute 0.0 0.0 - 0.1 K/uL  Sample to Blood Bank  Result Value Ref Range   Blood Bank Specimen SAMPLE AVAILABLE FOR TESTING    Sample Expiration      11/06/2017 Performed at [facility], [address] [place], [place] [address]    Laboratory interpretation all normal except mild worsening of her usual stable anemia, leukocytosis    EKG None  Radiology Dg Chest 2 View  Result Date: 11/03/2017 CLINICAL DATA:  72 year old female with hemoptysis. History of bladder cancer. EXAM: CHEST - 2 VIEW COMPARISON:  None. FINDINGS: Right pectoral Port-A-Cath with tip in the region of the cavoatrial junction. There is a moderate right pleural effusion with associated compressive atelectasis of the right lower lobe. Pneumonia is not excluded. Patchy area of consolidation involving the right upper lobe may represent pneumonia although underlying mass is not entirely excluded. Clinical correlation and follow-up to resolution recommended. There is a 3.2 x 3.0 cm focal rounded opacity or mass in the left upper lobe. There is trace left pleural effusion. No pneumothorax. The cardiac silhouette is within normal limits. No acute osseous pathology. IMPRESSION: 1. Moderate right and small left pleural effusions. Right lung base compressive atelectasis versus infiltrate. 2. Right upper lobe consolidation may represent pneumonia. Clinical correlation and follow-up to resolution recommended to exclude underlying mass. 3. Rounded left upper lobe focal opacity versus mass. CT may provide better evaluation. Electronically Signed   By: Anner Crete M.D.   On: 11/03/2017 03:30   Ct Chest W Contrast  Result Date: 11/03/2017 CLINICAL DATA:  72 year old female with  hemoptysis. 72 year old female with hemoptysis EXAM: CT CHEST WITH CONTRAST TECHNIQUE: Multidetector CT imaging of the chest was performed during intravenous contrast administration. CONTRAST:  5mL ISOVUE-300 IOPAMIDOL (ISOVUE-300)  INJECTION 61% COMPARISON:  Chest radiograph dated 11/03/2017 FINDINGS: Cardiovascular: Top-normal cardiac size. There is multi vessel coronary vascular calcification. There is a small pericardial effusion. There is mild atherosclerotic calcification of the thoracic aorta. No aneurysmal dilatation or dissection. Evaluation of the pulmonary arteries is limited due to suboptimal visualization of the peripheral branches. No large or central pulmonary artery embolus identified. Right pectoral Port-A-Cath with tip at the cavoatrial junction. Mediastinum/Nodes: Subcarinal lymph node measures 10 cm in short axis. Large right hilar mass extending into the mediastinum anterior to the trachea (see below). Esophagus is grossly unremarkable. A lymph node posterior to the trachea and along the right thoracic esophagus measures 6 mm in short axis. No axillary adenopathy. Lungs/Pleura: There is enlarged and irregular right upper lobe mass measuring approximately 9.7 x 6.4 x 6.4 cm. The mass extends from the right apical and right upper anterior pleural surface into the right hilar region. A 2.5 x 2.0 cm ovoid lesion anterior to the trachea in the mediastinum may represent extension of the mass into the mediastinum or a large mediastinal lymph nodes. There is mass effect and occlusion of the right upper lobe bronchus. There are areas of cavitary change within the lung parenchyma in the periphery of the mass. The right upper lobe mass extends into the right middle and right lower lobe. Patchy areas of ground-glass density in the right lower lobe adjacent to the mass may represent tumor infiltration or superimposed infection. There is a 1.8 x 2.3 cm left upper lobe solid nodule with irregular margins consistent with malignancy or metastatic disease. Additional smaller nodules throughout the left lung consistent with metastatic disease. There are areas of cavitary changes in the left upper lobe and left lower lobe. There are small  bilateral pleural effusions, right greater left. No pneumothorax. The central airways are patent. Upper Abdomen: Subcentimeter splenic hypodense lesion is not characterized. The visualized upper abdomen is otherwise unremarkable. Musculoskeletal: Osteopenia with degenerative changes of the spine. No acute osseous pathology. Small lucent lesions throughout the spine may be secondary to osteopenia or represent metastatic disease. IMPRESSION: 1. Large right hilar/suprahilar mass spanning from the right upper lobe into the right middle and right lower lobes. There is complete occlusion of the right upper lobe bronchus. 2. Multiple left lung pulmonary nodules consistent with metastatic disease. 3. Bilateral pulmonary cavitary lesions with intracavitary content which may represent necrotic debris, or fungal ball. 4. Patchy nodular and ground-glass density in the right lower lobe may represent tumor infiltration or pneumonia. 5. Small bilateral pleural effusions, right greater left. 6. Extension of the right upper lobe mass into the mediastinum anterior to the trachea. 7. No CT evidence of central pulmonary artery embolus. Electronically Signed   By: Anner Crete M.D.   On: 11/03/2017 06:31    Procedures Procedures (including critical care time)  Medications Ordered in ED Medications  iopamidol (ISOVUE-300) 61 % injection 75 mL (75 mLs Intravenous Contrast Given 11/03/17 0600)     Initial Impression / Assessment and Plan / ED Course  I have reviewed the triage vital signs and the nursing notes.  Pertinent labs & imaging results that were available during my care of the patient were reviewed by me and considered in my medical decision making (see chart for details).    Reviewing her oncologist's note from May  23 he notes she had a CT scan on March 29 which showed some interval worsening of the bilateral pulmonary metastatic disease with a possible new lung lesion.  4 AM I talked to the patient about  her chest x-ray results and need to get CT.  IV was inserted and laboratory blood testing was done.  She states she has not had any hemoptysis since she has been in the ED.  06:53 AM Dr Shirlee Latch, oncology, discussed her CT results.  Unfortunately her prior CT scans were done at [facility] and are not visible to [place].  He states to admit her to observation on the hospitalist service.  Patient was informed that her oncologist recommend that she stay in the hospital today for observation.  I also discussed with her that they may want her to go to [place] depending on what the hospitalist feels she may need today.  She is agreeable.  7:33 AM Dr. Roderic Palau, hospitalist will see patient.  Final Clinical Impressions(s) / ED Diagnoses   Final diagnoses:  Hemoptysis  Multiple lesions of metastatic malignancy ([place])  Anemia, unspecified type    Plan admission  Rolland Porter, MD, Barbette Or, MD 11/03/17 [phone]

## 2017-11-03 NOTE — H&P (Addendum)
History and Physical    RAINEY RODGER DJM:[id number] DOB: 1945/08/24 DOA: 11/03/2017  PCP: Joseph Art, MD  Patient coming from: Home  I have personally briefly reviewed patient's old medical records in [place]  Chief Complaint: Hemoptysis  HPI: AMANDALYNN PITZ is a 72 y.o. 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  Reports onset of symptoms occurring yesterday morning.  She has been coughing up blood, a quarter sized amount every 15 minutes.  She denies any fever.  She has had no chest pain.  She has not had any worsening shortness of breath.  She has not had any vomiting or diarrhea.  Due to the persistence of her symptoms, she came to the ER for evaluation.  At baseline, she is able to ambulate without any oxygen or assistance of cane/walker.  She reports that she can ambulate around her home without significant difficulty.  Her weight has been steady.  ED Course: Vitals are noted to be stable aside from some mild tachycardia.  Blood pressure was noted to be stable.  She underwent imaging with CT chest indicated extensive pulmonary metastatic disease.  One mass has occluded her right upper bronchus. She has been referred for admission.  Review of Systems: As per HPI otherwise 10 point review of systems negative.    Past Medical History:  Diagnosis Date  . Bladder cancer ([place])   . GERD (gastroesophageal reflux disease)   . Hypertension   . Hypomagnesemia   . Renal failure     Past Surgical History:  Procedure Laterality Date  . ABDOMINAL HYSTERECTOMY    . NEPHROSTOMY      right kidney     reports that she has never smoked. She has never used smokeless tobacco. She reports that she drank alcohol. She reports that she has current or past drug history.  Allergies  Allergen Reactions  . Lortab [Hydrocodone-Acetaminophen]     Family history: Family history reviewed and not  pertinent  Prior to Admission medications   Medication Sig Start Date End Date Taking? Authorizing Provider  ALPRAZolam (XANAX PO) Take 0.25 mg by mouth as needed.    Yes [provider]  amLODipine (NORVASC) 5 MG tablet Take 1 tablet (5 mg total) by mouth daily. 09/26/17  Yes Derek Jack, MD  cholecalciferol (VITAMIN D) 400 units TABS tablet Take 400 Units by mouth daily.   Yes [provider]  Cyanocobalamin (VITAMIN B 12 PO) Take 1 mg by mouth daily.   Yes [provider]  dexamethasone (DECADRON) 4 MG tablet Take 1 tablet (4 mg total) by mouth 2 (two) times daily with a meal. Take for 2-3 days after each chemo treatment 09/06/17  Yes Derek Jack, MD  diltiazem (CARDIZEM CD) 240 MG 24 hr capsule 1 TABLET AT THE SAME TIME EACH DAY ONCE A DAY ORALLY 07/24/17  Yes [provider]  DOCEtaxel (TAXOTERE IV) Inject into the vein.   Yes [provider]  guaiFENesin-codeine 100-10 MG/5ML syrup TAKE 10 ML BY MOUTH 4 TIMES A DAY AS NEEDED COUGH 09/26/17  Yes Derek Jack, MD  lansoprazole (PREVACID) 30 MG capsule Take 30 mg by mouth daily at 12 noon.   Yes [provider]  magic mouthwash w/lidocaine SOLN SWISH AND SPIT 5MLS 4 TIMES A DAY 10/17/17  Yes Derek Jack, MD  magnesium oxide (MAG-OX) 400 MG tablet Take 1 tablet by mouth 2 (two) times daily.  07/31/17  Yes [provider]  meclizine (ANTIVERT) 25 MG tablet Take 25 mg by mouth 2 (two) times daily.    Yes [provider]  megestrol (MEGACE) 40 MG/ML suspension Take by mouth 2 (two) times daily.   Yes [provider]  ondansetron (ZOFRAN-ODT) 4 MG disintegrating tablet Take 4 mg by mouth 3 (three) times daily. 06/07/17  Yes [provider]  prochlorperazine (COMPAZINE) 10 MG tablet Take 10 mg by mouth every 6 (six) hours as needed for nausea or vomiting.   Yes [provider]  Ramucirumab (CYRAMZA IV) Inject into the vein.    Yes [provider]  TRANSDERM-SCOP, 1.5 MG, 1 MG/3DAYS APPLY 1 PATCH EVERY 72 HOURS 08/01/17  Yes [provider]  Misc. Devices MISC 15 mLs by Does not apply route every 3 (three) hours as needed. Maalox+ Lidocaine mix one to one. 1 tablespoon swish and swallow or swish and spit q 3-4 hours prn for mouth sores. Patient not taking: Reported on 11/03/2017 09/13/17   Derek Jack, MD    Physical Exam: Vitals:   11/03/17 0800 11/03/17 0830 11/03/17 0900 11/03/17 1000  BP: 130/79 134/71 133/73 140/75  Pulse: (!) 109 (!) 109 (!) 108 (!) 114  Resp: (!) 30 (!) 27 (!) 28 (!) 26  Temp:      TempSrc:      SpO2: 100% 98% 100% 98%  Weight:      Height:        Constitutional: NAD, calm, comfortable Vitals:   11/03/17 0800 11/03/17 0830 11/03/17 0900 11/03/17 1000  BP: 130/79 134/71 133/73 140/75  Pulse: (!) 109 (!) 109 (!) 108 (!) 114  Resp: (!) 30 (!) 27 (!) 28 (!) 26  Temp:      TempSrc:      SpO2: 100% 98% 100% 98%  Weight:      Height:       Eyes: PERRL, lids and conjunctivae normal ENMT: Mucous membranes are moist. Posterior pharynx clear of any exudate or lesions.Normal dentition.  Neck: normal, supple, no masses, no thyromegaly Respiratory: clear to auscultation bilaterally, no wheezing, no crackles. Normal respiratory effort. No accessory muscle use.  Cardiovascular: Regular rate and rhythm, no murmurs / rubs / gallops. 1+ extremity edema. 2+ pedal pulses. No carotid bruits.  Abdomen: no tenderness, no masses palpated. No hepatosplenomegaly. Bowel sounds positive.  Musculoskeletal: no clubbing / cyanosis. No joint deformity upper and lower extremities. Good ROM, no contractures. Normal muscle tone.  Skin: no rashes, lesions, ulcers. No induration Neurologic: CN 2-12 grossly intact. Sensation intact, DTR normal. Strength 5/5 in all 4.  Psychiatric: Normal judgment and insight. Alert and oriented x 3. Normal mood.    Labs on Admission: I have personally  reviewed following labs and imaging studies  CBC: Recent Labs  Lab 11/03/17 0440  WBC 12.3*  NEUTROABS 11.0*  HGB 8.8*  HCT 28.4*  MCV 97.6  PLT 696   Basic Metabolic Panel: Recent Labs  Lab 11/03/17 0440  NA 136  K 4.2  CL 102  CO2 25  GLUCOSE 104*  BUN 10  CREATININE 0.75  CALCIUM 8.5*   GFR: Estimated Creatinine Clearance: 45 mL/min (by C-G formula based on SCr of 0.75 mg/dL). Liver Function Tests: Recent Labs  Lab 11/03/17 0440  AST 13*  ALT 7  ALKPHOS 124  BILITOT 1.0  PROT 6.2*  ALBUMIN 3.1*   No results for input(s): LIPASE, AMYLASE in the last 168 hours. No results for input(s): AMMONIA in  the last 168 hours. Coagulation Profile: No results for input(s): INR, PROTIME in the last 168 hours. Cardiac Enzymes: No results for input(s): CKTOTAL, CKMB, CKMBINDEX, TROPONINI in the last 168 hours. BNP (last 3 results) No results for input(s): PROBNP in the last 8760 hours. HbA1C: No results for input(s): HGBA1C in the last 72 hours. CBG: No results for input(s): GLUCAP in the last 168 hours. Lipid Profile: No results for input(s): CHOL, HDL, LDLCALC, TRIG, CHOLHDL, LDLDIRECT in the last 72 hours. Thyroid Function Tests: No results for input(s): TSH, T4TOTAL, FREET4, T3FREE, THYROIDAB in the last 72 hours. Anemia Panel: No results for input(s): VITAMINB12, FOLATE, FERRITIN, TIBC, IRON, RETICCTPCT in the last 72 hours. Urine analysis:    Component Value Date/Time   COLORURINE AMBER (A) 10/17/2017 0921   APPEARANCEUR CLOUDY (A) 10/17/2017 0921   LABSPEC 1.019 10/17/2017 0921   PHURINE 5.0 10/17/2017 0921   GLUCOSEU NEGATIVE 10/17/2017 0921   HGBUR NEGATIVE 10/17/2017 0921   BILIRUBINUR NEGATIVE 10/17/2017 0921   KETONESUR NEGATIVE 10/17/2017 0921   PROTEINUR 30 (A) 10/17/2017 0921   NITRITE POSITIVE (A) 10/17/2017 0921   LEUKOCYTESUR MODERATE (A) 10/17/2017 0921    Radiological Exams on Admission: Dg Chest 2 View  Result Date:  11/03/2017 CLINICAL DATA:  72 year old female with hemoptysis. History of bladder cancer. EXAM: CHEST - 2 VIEW COMPARISON:  None. FINDINGS: Right pectoral Port-A-Cath with tip in the region of the cavoatrial junction. There is a moderate right pleural effusion with associated compressive atelectasis of the right lower lobe. Pneumonia is not excluded. Patchy area of consolidation involving the right upper lobe may represent pneumonia although underlying mass is not entirely excluded. Clinical correlation and follow-up to resolution recommended. There is a 3.2 x 3.0 cm focal rounded opacity or mass in the left upper lobe. There is trace left pleural effusion. No pneumothorax. The cardiac silhouette is within normal limits. No acute osseous pathology. IMPRESSION: 1. Moderate right and small left pleural effusions. Right lung base compressive atelectasis versus infiltrate. 2. Right upper lobe consolidation may represent pneumonia. Clinical correlation and follow-up to resolution recommended to exclude underlying mass. 3. Rounded left upper lobe focal opacity versus mass. CT may provide better evaluation. Electronically Signed   By: Anner Crete M.D.   On: 11/03/2017 03:30   Ct Chest W Contrast  Result Date: 11/03/2017 CLINICAL DATA:  72 year old female with hemoptysis. 72 year old female with hemoptysis EXAM: CT CHEST WITH CONTRAST TECHNIQUE: Multidetector CT imaging of the chest was performed during intravenous contrast administration. CONTRAST:  35mL ISOVUE-300 IOPAMIDOL (ISOVUE-300) INJECTION 61% COMPARISON:  Chest radiograph dated 11/03/2017 FINDINGS: Cardiovascular: Top-normal cardiac size. There is multi vessel coronary vascular calcification. There is a small pericardial effusion. There is mild atherosclerotic calcification of the thoracic aorta. No aneurysmal dilatation or dissection. Evaluation of the pulmonary arteries is limited due to suboptimal visualization of the peripheral branches. No large or  central pulmonary artery embolus identified. Right pectoral Port-A-Cath with tip at the cavoatrial junction. Mediastinum/Nodes: Subcarinal lymph node measures 10 cm in short axis. Large right hilar mass extending into the mediastinum anterior to the trachea (see below). Esophagus is grossly unremarkable. A lymph node posterior to the trachea and along the right thoracic esophagus measures 6 mm in short axis. No axillary adenopathy. Lungs/Pleura: There is enlarged and irregular right upper lobe mass measuring approximately 9.7 x 6.4 x 6.4 cm. The mass extends from the right apical and right upper anterior pleural surface into the right hilar region. A 2.5 x 2.0 cm  ovoid lesion anterior to the trachea in the mediastinum may represent extension of the mass into the mediastinum or a large mediastinal lymph nodes. There is mass effect and occlusion of the right upper lobe bronchus. There are areas of cavitary change within the lung parenchyma in the periphery of the mass. The right upper lobe mass extends into the right middle and right lower lobe. Patchy areas of ground-glass density in the right lower lobe adjacent to the mass may represent tumor infiltration or superimposed infection. There is a 1.8 x 2.3 cm left upper lobe solid nodule with irregular margins consistent with malignancy or metastatic disease. Additional smaller nodules throughout the left lung consistent with metastatic disease. There are areas of cavitary changes in the left upper lobe and left lower lobe. There are small bilateral pleural effusions, right greater left. No pneumothorax. The central airways are patent. Upper Abdomen: Subcentimeter splenic hypodense lesion is not characterized. The visualized upper abdomen is otherwise unremarkable. Musculoskeletal: Osteopenia with degenerative changes of the spine. No acute osseous pathology. Small lucent lesions throughout the spine may be secondary to osteopenia or represent metastatic disease.  IMPRESSION: 1. Large right hilar/suprahilar mass spanning from the right upper lobe into the right middle and right lower lobes. There is complete occlusion of the right upper lobe bronchus. 2. Multiple left lung pulmonary nodules consistent with metastatic disease. 3. Bilateral pulmonary cavitary lesions with intracavitary content which may represent necrotic debris, or fungal ball. 4. Patchy nodular and ground-glass density in the right lower lobe may represent tumor infiltration or pneumonia. 5. Small bilateral pleural effusions, right greater left. 6. Extension of the right upper lobe mass into the mediastinum anterior to the trachea. 7. No CT evidence of central pulmonary artery embolus. Electronically Signed   By: Anner Crete M.D.   On: 11/03/2017 06:31      Assessment/Plan Principal Problem:   Hemoptysis Active Problems:   Bladder cancer metastasized to lung (HCC)   Urothelial cancer (HCC)   HTN (hypertension)   Anemia in other chronic diseases classified elsewhere   GERD (gastroesophageal reflux disease)     1. Hemoptysis.  Discussed with her primary oncologist, Dr. Delton Coombes, who feels this may be related to her recent chemotherapy.  Certainly, her underlying metastatic disease could be causing this.  She does not have any fever or worsening shortness of breath.  There is described groundglass appearance on imaging.  Prior images from [facility] are not available for review.  Recommendations are for admission for observation.  Dr. Delton Coombes will review images with radiation oncology to see if she needs radiation therapy at this time.  Since she does have groundglass appearance and new cough, will empirically start antibiotics for now.  Continue to monitor. 2. Metastatic bladder cancer.  Plans are to follow-up with oncology for further treatments. 3. Anemia, likely multifactorial.  Suspect there is a component related to chronic disease from underlying malignancy as well as recent  chemotherapy.  She may have some blood loss from hemoptysis.  We will continue to monitor for now. 4. GERD.  Continue on PPI 5. Hypertension.  Blood pressure currently stable.  Continue on home dose of diltiazem.  DVT prophylaxis: scds  Code Status: Full code, confirmed with patient Family Communication: Discussed with daughter at the bedside Disposition Plan: Discharge home once improved Consults called: Telephone conversation with her primary oncologist Admission status: Observation, telemetry  Kathie Dike MD [facility] Pager [phone]  If 7PM-7AM, please contact night-coverage [email or web address] Password [facility]  11/03/2017, 10:37  AM     Addendum 12:00:  Discussed case with Dr. Delton Coombes who has reviewed case with Dr. Lisbeth Renshaw. Recommendations are to transfer patient to [facility] to be evaluated for radiation therapy. Patient and daughter aware and are in agreement. Case discussed with [facility]. Wynelle Cleveland at [facility].  [employer]

## 2017-11-03 NOTE — ED Notes (Signed)
Pt ambulatory to bathroom and back to room 

## 2017-11-03 NOTE — ED Notes (Signed)
Report to Estill Dooms, RN  To floor

## 2017-11-03 NOTE — ED Triage Notes (Signed)
Pt reports being diagnosed with bladder CA in 2012 with mets to lungs 2 years ago. Pt reports coughing up blood intermittently since yesterday. Pt denies pain. Pt denies any increased SOB other than usual dyspnea with exertion. Pt last chemo was 10/17/17.

## 2017-11-04 ENCOUNTER — Encounter (HOSPITAL_COMMUNITY): Payer: Self-pay

## 2017-11-04 ENCOUNTER — Ambulatory Visit
Admit: 2017-11-04 | Discharge: 2017-11-04 | Disposition: A | Discharge status: Home / Self Care | Payer: Medicare Other | Attending: Radiation Oncology | Admitting: Radiation Oncology

## 2017-11-04 ENCOUNTER — Ambulatory Visit
Admission: RE | Admit: 2017-11-04 | Discharge: 2017-11-04 | Disposition: A | Discharge status: Home / Self Care | Payer: Medicare Other | Source: Ambulatory Visit | Attending: Radiation Oncology | Admitting: Radiation Oncology

## 2017-11-04 DIAGNOSIS — Z7952 Long term (current) use of systemic steroids: Secondary | ICD-10-CM | POA: Diagnosis not present

## 2017-11-04 DIAGNOSIS — C7801 Secondary malignant neoplasm of right lung: Secondary | ICD-10-CM | POA: Insufficient documentation

## 2017-11-04 DIAGNOSIS — Z7189 Other specified counseling: Secondary | ICD-10-CM | POA: Diagnosis not present

## 2017-11-04 DIAGNOSIS — C7800 Secondary malignant neoplasm of unspecified lung: Secondary | ICD-10-CM

## 2017-11-04 DIAGNOSIS — R531 Weakness: Secondary | ICD-10-CM | POA: Diagnosis present

## 2017-11-04 DIAGNOSIS — T451X5A Adverse effect of antineoplastic and immunosuppressive drugs, initial encounter: Secondary | ICD-10-CM | POA: Diagnosis present

## 2017-11-04 DIAGNOSIS — Z510 Encounter for antineoplastic radiation therapy: Secondary | ICD-10-CM | POA: Insufficient documentation

## 2017-11-04 DIAGNOSIS — Z79899 Other long term (current) drug therapy: Secondary | ICD-10-CM | POA: Diagnosis not present

## 2017-11-04 DIAGNOSIS — Z885 Allergy status to narcotic agent status: Secondary | ICD-10-CM | POA: Diagnosis not present

## 2017-11-04 DIAGNOSIS — Z905 Acquired absence of kidney: Secondary | ICD-10-CM | POA: Diagnosis not present

## 2017-11-04 DIAGNOSIS — Z515 Encounter for palliative care: Secondary | ICD-10-CM | POA: Diagnosis not present

## 2017-11-04 DIAGNOSIS — C679 Malignant neoplasm of bladder, unspecified: Secondary | ICD-10-CM | POA: Diagnosis present

## 2017-11-04 DIAGNOSIS — C689 Malignant neoplasm of urinary organ, unspecified: Secondary | ICD-10-CM

## 2017-11-04 DIAGNOSIS — D638 Anemia in other chronic diseases classified elsewhere: Secondary | ICD-10-CM | POA: Diagnosis present

## 2017-11-04 DIAGNOSIS — C799 Secondary malignant neoplasm of unspecified site: Secondary | ICD-10-CM | POA: Diagnosis not present

## 2017-11-04 DIAGNOSIS — K219 Gastro-esophageal reflux disease without esophagitis: Secondary | ICD-10-CM | POA: Diagnosis present

## 2017-11-04 DIAGNOSIS — I10 Essential (primary) hypertension: Secondary | ICD-10-CM | POA: Diagnosis present

## 2017-11-04 DIAGNOSIS — R042 Hemoptysis: Secondary | ICD-10-CM | POA: Diagnosis present

## 2017-11-04 LAB — URINALYSIS, ROUTINE W REFLEX MICROSCOPIC
Bilirubin Urine: NEGATIVE
Glucose, UA: NEGATIVE mg/dL
KETONES UR: 5 mg/dL — AB
Nitrite: NEGATIVE
PROTEIN: NEGATIVE mg/dL
Specific Gravity, Urine: 1.021 (ref 1.005–1.030)
pH: 5 (ref 5.0–8.0)

## 2017-11-04 MED ORDER — ONDANSETRON 4 MG PO TBDP
4.0000 mg | ORAL_TABLET | Freq: Three times a day (TID) | ORAL | Status: DC | PRN
  Administered 2017-11-06: 4 mg via ORAL
  Filled 2017-11-04: qty 1

## 2017-11-04 NOTE — Progress Notes (Signed)
PROGRESS NOTE    Jodi [name]  RWE:[id number] DOB: 02-Dec-1945 DOA: 11/03/2017 PCP: Joseph Art, MD   Brief Narrative:  72 year old female with a history of metastatic bladder cancer, essential hypertension, GERD came to the hospital with complaints of hemoptysis that started 2 days prior to her admission and had slightly worsened.  She initially presented to anything hospital and on the CT chest she was found to have extensive pulmonary metastatic disease with one mass occluding her right upper bronchus.  Case was discussed with on-call radiation oncology and her oncologist who recommended transferring the patient to [facility] for further care and management.   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  Palliative care team has been consulted to assist given her advanced/aggressive cancer.  At this point we will continue to provide her supportive care, supplemental oxygen as needed.  Pain control -Currently empirically on Unasyn  Anemia chronic disease -Closely monitor hemoglobin.  Transfuse if necessary.  Besides mild hemoptysis she does not have any other active signs of bleeding.  GERD -Continue Protonix 40 mg daily  Essential hypertension -Continue Cardizem 240 mg daily  DVT prophylaxis: SCDs Code Status: Full code Family Communication: Daughter at bedside Disposition Plan: Maintain hospital stay  Consultants:  Radiation oncology Palliative care  Procedures:   None  Antimicrobials:   Unasyn  6/30   Subjective: No new complaints.  Does have mild hemoptysis this morning.  Review of Systems Otherwise negative except as per HPI, including: General: Denies fever, chills, night sweats or unintended weight loss. Resp: Denies cough, wheezing, shortness of breath. Cardiac: Denies chest pain, palpitations, orthopnea, paroxysmal nocturnal dyspnea. GI: Denies abdominal pain, nausea, vomiting, diarrhea or constipation GU: Denies dysuria, frequency, hesitancy or incontinence MS: Denies muscle aches, joint pain or swelling Neuro: Denies headache, neurologic deficits (focal weakness, numbness, tingling), abnormal gait Psych: Denies anxiety, depression, SI/HI/AVH Skin: Denies new rashes or lesions ID: Denies sick contacts, exotic exposures, travel  Objective: Vitals:   11/03/17 2005 11/03/17 2154 11/03/17 2155 11/04/17 0547  BP: 123/60 125/82  129/75  Pulse: (!) 108 (!) 122  (!) 109  Resp: 20 20  16   Temp: 98.2 F (36.8 C) 98.3 F (36.8 C)  98.6 F (37 C)  TempSrc: Oral Oral  Oral  SpO2: 93% 96%  99%  Weight:   53.1 kg (117 lb 1 oz)   Height:   5' (1.524 m)     Intake/Output Summary (Last 24 hours) at 11/04/2017 1255 Last data filed at 11/04/2017 0600 Gross per 24 hour  Intake 458.5 ml  Output -  Net 458.5 ml   Filed Weights   11/03/17 0251 11/03/17 2155  Weight: 52.6 kg (116 lb) 53.1 kg (117 lb 1 oz)    Examination:  General exam: Appears calm and comfortable, elderly frail-appearing.  Chemo-Port in place. Respiratory system: Clear to auscultation. Respiratory effort normal. Cardiovascular system: S1 & S2 heard, RRR. No JVD, murmurs, rubs, gallops or clicks. No pedal edema. Gastrointestinal system: Abdomen is nondistended, soft and nontender. No organomegaly or masses felt. Normal bowel sounds heard. Central nervous system: Alert  and oriented. No focal neurological deficits. Extremities: Symmetric 5 x 5 power. Skin: No rashes, lesions or ulcers Psychiatry: Judgement  and insight appear normal. Mood & affect appropriate.     Data Reviewed:   CBC: Recent Labs  Lab 11/03/17 0440  WBC 12.3*  NEUTROABS 11.0*  HGB 8.8*  HCT 28.4*  MCV 97.6  PLT 010   Basic Metabolic Panel: Recent Labs  Lab 11/03/17 0440  NA 136  K 4.2  CL 102  CO2 25  GLUCOSE 104*  BUN 10  CREATININE 0.75  CALCIUM 8.5*   GFR: Estimated Creatinine Clearance: 45 mL/min (by C-G formula based on SCr of 0.75 mg/dL). Liver Function Tests: Recent Labs  Lab 11/03/17 0440  AST 13*  ALT 7  ALKPHOS 124  BILITOT 1.0  PROT 6.2*  ALBUMIN 3.1*   No results for input(s): LIPASE, AMYLASE in the last 168 hours. No results for input(s): AMMONIA in the last 168 hours. Coagulation Profile: Recent Labs  Lab 11/03/17 0447  INR 1.11   Cardiac Enzymes: No results for input(s): CKTOTAL, CKMB, CKMBINDEX, TROPONINI in the last 168 hours. BNP (last 3 results) No results for input(s): PROBNP in the last 8760 hours. HbA1C: No results for input(s): HGBA1C in the last 72 hours. CBG: No results for input(s): GLUCAP in the last 168 hours. Lipid Profile: No results for input(s): CHOL, HDL, LDLCALC, TRIG, CHOLHDL, LDLDIRECT in the last 72 hours. Thyroid Function Tests: No results for input(s): TSH, T4TOTAL, FREET4, T3FREE, THYROIDAB in the last 72 hours. Anemia Panel: No results for input(s): VITAMINB12, FOLATE, FERRITIN, TIBC, IRON, RETICCTPCT in the last 72 hours. Sepsis Labs: No results for input(s): PROCALCITON, LATICACIDVEN in the last 168 hours.  No results found for this or any previous visit (from the past 240 hour(s)).       Radiology Studies: Dg Chest 2 View  Result Date: 11/03/2017 CLINICAL DATA:  72 year old female with hemoptysis. History of bladder cancer. EXAM: CHEST - 2 VIEW COMPARISON:  None. FINDINGS: Right pectoral Port-A-Cath with tip in the region of the cavoatrial junction. There is a moderate right pleural effusion with associated compressive atelectasis  of the right lower lobe. Pneumonia is not excluded. Patchy area of consolidation involving the right upper lobe may represent pneumonia although underlying mass is not entirely excluded. Clinical correlation and follow-up to resolution recommended. There is a 3.2 x 3.0 cm focal rounded opacity or mass in the left upper lobe. There is trace left pleural effusion. No pneumothorax. The cardiac silhouette is within normal limits. No acute osseous pathology. IMPRESSION: 1. Moderate right and small left pleural effusions. Right lung base compressive atelectasis versus infiltrate. 2. Right upper lobe consolidation may represent pneumonia. Clinical correlation and follow-up to resolution recommended to exclude underlying mass. 3. Rounded left upper lobe focal opacity versus mass. CT may provide better evaluation. Electronically Signed   By: Anner Crete M.D.   On: 11/03/2017 03:30   Ct Chest W Contrast  Result Date: 11/03/2017 CLINICAL DATA:  72 year old female with hemoptysis. 72 year old female with hemoptysis EXAM: CT CHEST WITH CONTRAST TECHNIQUE: Multidetector CT imaging of the chest was performed during intravenous contrast administration. CONTRAST:  2mL ISOVUE-300 IOPAMIDOL (ISOVUE-300) INJECTION 61% COMPARISON:  Chest radiograph dated 11/03/2017 FINDINGS: Cardiovascular: Top-normal cardiac size. There is multi vessel coronary vascular calcification. There is a small pericardial effusion. There is mild atherosclerotic calcification of the thoracic aorta. No aneurysmal dilatation or dissection. Evaluation of the pulmonary arteries is limited due to suboptimal visualization of the peripheral  branches. No large or central pulmonary artery embolus identified. Right pectoral Port-A-Cath with tip at the cavoatrial junction. Mediastinum/Nodes: Subcarinal lymph node measures 10 cm in short axis. Large right hilar mass extending into the mediastinum anterior to the trachea (see below). Esophagus is grossly  unremarkable. A lymph node posterior to the trachea and along the right thoracic esophagus measures 6 mm in short axis. No axillary adenopathy. Lungs/Pleura: There is enlarged and irregular right upper lobe mass measuring approximately 9.7 x 6.4 x 6.4 cm. The mass extends from the right apical and right upper anterior pleural surface into the right hilar region. A 2.5 x 2.0 cm ovoid lesion anterior to the trachea in the mediastinum may represent extension of the mass into the mediastinum or a large mediastinal lymph nodes. There is mass effect and occlusion of the right upper lobe bronchus. There are areas of cavitary change within the lung parenchyma in the periphery of the mass. The right upper lobe mass extends into the right middle and right lower lobe. Patchy areas of ground-glass density in the right lower lobe adjacent to the mass may represent tumor infiltration or superimposed infection. There is a 1.8 x 2.3 cm left upper lobe solid nodule with irregular margins consistent with malignancy or metastatic disease. Additional smaller nodules throughout the left lung consistent with metastatic disease. There are areas of cavitary changes in the left upper lobe and left lower lobe. There are small bilateral pleural effusions, right greater left. No pneumothorax. The central airways are patent. Upper Abdomen: Subcentimeter splenic hypodense lesion is not characterized. The visualized upper abdomen is otherwise unremarkable. Musculoskeletal: Osteopenia with degenerative changes of the spine. No acute osseous pathology. Small lucent lesions throughout the spine may be secondary to osteopenia or represent metastatic disease. IMPRESSION: 1. Large right hilar/suprahilar mass spanning from the right upper lobe into the right middle and right lower lobes. There is complete occlusion of the right upper lobe bronchus. 2. Multiple left lung pulmonary nodules consistent with metastatic disease. 3. Bilateral pulmonary  cavitary lesions with intracavitary content which may represent necrotic debris, or fungal ball. 4. Patchy nodular and ground-glass density in the right lower lobe may represent tumor infiltration or pneumonia. 5. Small bilateral pleural effusions, right greater left. 6. Extension of the right upper lobe mass into the mediastinum anterior to the trachea. 7. No CT evidence of central pulmonary artery embolus. Electronically Signed   By: Anner Crete M.D.   On: 11/03/2017 06:31        Scheduled Meds: . diltiazem  240 mg Oral Daily  . magic mouthwash  5 mL Oral QID   And  . lidocaine  5 mL Mouth/Throat QID  . meclizine  25 mg Oral BID  . pantoprazole  40 mg Oral Daily  . [START ON 11/05/2017] scopolamine  1 patch Transdermal Q72H  . sodium chloride flush  3 mL Intravenous Q12H   Continuous Infusions: . sodium chloride 250 mL (11/03/17 1409)  . ampicillin-sulbactam (UNASYN) IV Stopped (11/04/17 0730)     LOS: 0 days    I have spent 35 minutes face to face with the patient and on the ward discussing the patients care, assessment, plan and disposition with other care givers. >50% of the time was devoted counseling the patient about the risks and benefits of treatment and coordinating care.     Sonali Wivell Arsenio Loader, MD [facility] Pager [phone]   If 7PM-7AM, please contact night-coverage [email or web address] Password [email or web address] 11/04/2017, 12:55 PM

## 2017-11-04 NOTE — Progress Notes (Signed)
[facility] Therapy Treatment Record Phone [phone]   Radiation Therapy was administered to Jodi [name] on: 11/04/2017  12:17 PM and was treatment # 1 out of a planned course of 10 treatments.  Radiation Treatment  1). Beam photons with 6-10 energy  2). Brachytherapy None  3). Stereotactic Radiosurgery None  4). Other Radiation None     Advith Martine J Willodean Leven, RT

## 2017-11-04 NOTE — Care Management Obs Status (Signed)
[place] NOTIFICATION   Patient Details  Name: Jodi [name] MRN: [id number] Date of Birth: 12/15/1945   Medicare Observation Status Notification Given:  Yes    Leeroy Cha, RN 11/04/2017, 9:38 AM

## 2017-11-04 NOTE — Consult Note (Signed)
Radiation Oncology         [phone] ________________________________  Name: MALONIE TATUM        MRN: [id number]  Date of Service: 11/04/17                DOB: 08/06/1945  XT:GGYIR, Caren T, MD  No ref. provider found     REFERRING PHYSICIAN: No ref. provider found   DIAGNOSIS: The primary encounter diagnosis was Hemoptysis. Diagnoses of Multiple lesions of metastatic malignancy ([place]) and Anemia, unspecified type were also pertinent to this visit.   HISTORY OF PRESENT ILLNESS: KINZLEIGH KANDLER is a 72 y.o. female seen at the request of Dr. Delton Coombes and Dr. 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  She was found to have recurrent disease that had metastasized to the lungs in January 2017 and has been on several courses of systemic therapy She had been on pemetrexed every 3 weeks between October 2018 in March 2019, and her CT scan on 08/02/2017 revealed progressive disease in the lungs.  She was started on docetaxel and Cyramza on 09/04/2017.  She was seen on 10/17/2017 for cycle #3 of this regimen.  In the last week however she has noticed progressive cough with episodes of hemoptysis.  She was seen in the emergency room at [facility] and taken in transfer to [facility] to consider palliative radiotherapy.  While in [place] she did undergo CT imaging of the chest revealing a 9.7 x 6.4 x 6.4 cm mass in the right upper from the right apical and right upper anterior pleural surface into the right hilum.  A 2.5 x 2 cm ovoid lesion anterior to the trachea in the mediastinum may represent extension of the mass into the mediastinum or large mediastinal adenopathy.  There was mass-effect on the right upper lobe bronchus and cavitary change in the lung parenchyma.  The right upper lobe mass extends into  the right middle and right lower lobe and patchy groundglass density in the right lower lobe adjacent to the mass was also noted either consideration for superimposed infection or infiltrative tumor was noted.  There was also a 1.8 x 2.3 cm left upper lobe solid nodule with irregular margins in the left lung.  We are seeing her this morning to consider this palliative regimen.    PREVIOUS RADIATION THERAPY: No   PAST MEDICAL HISTORY:  Past Medical History:  Diagnosis Date  . Bladder cancer ([place])   . GERD (gastroesophageal reflux disease)   . Hypertension   . Hypomagnesemia   . Renal failure        PAST SURGICAL HISTORY: Past Surgical History:  Procedure Laterality Date  . ABDOMINAL HYSTERECTOMY    . NEPHROSTOMY      right kidney     FAMILY HISTORY: History reviewed. No pertinent family history.   SOCIAL HISTORY:  reports that she has never smoked. She has never used smokeless tobacco. She reports that she drank alcohol. She reports that she has current or past drug history.   ALLERGIES: Lortab [hydrocodone-acetaminophen]   MEDICATIONS:  Current Facility-Administered Medications  Medication Dose Route Frequency Provider Last Rate Last Dose  . 0.9 %  sodium chloride infusion  250 mL Intravenous PRN Kathie Dike, MD 10 mL/hr at 11/03/17 1409 250 mL at 11/03/17 1409  .  acetaminophen (TYLENOL) tablet 650 mg  650 mg Oral Q6H PRN Kathie Dike, MD       Or  . acetaminophen (TYLENOL) suppository 650 mg  650 mg Rectal Q6H PRN Kathie Dike, MD      . ALPRAZolam Duanne Moron) tablet 0.25 mg  0.25 mg Oral BID PRN Kathie Dike, MD      . Ampicillin-Sulbactam (UNASYN) 3 g in sodium chloride 0.9 % 100 mL IVPB  3 g Intravenous Q6H Kathie Dike, MD 200 mL/hr at 11/04/17 0636 3 g at 11/04/17 0636  . diltiazem (CARDIZEM CD) 24 hr capsule 240 mg  240 mg Oral Daily Kathie Dike, MD   240 mg at 11/03/17 1315  . guaiFENesin-codeine 100-10 MG/5ML solution 10 mL  10 mL Oral Q4H PRN  Kathie Dike, MD      . magic mouthwash  5 mL Oral QID Kathie Dike, MD   5 mL at 11/03/17 2336   And  . lidocaine (XYLOCAINE) 2 % viscous mouth solution 5 mL  5 mL Mouth/Throat QID Kathie Dike, MD   5 mL at 11/03/17 2336  . meclizine (ANTIVERT) tablet 25 mg  25 mg Oral BID Kathie Dike, MD   25 mg at 11/03/17 2335  . ondansetron (ZOFRAN-ODT) disintegrating tablet 4 mg  4 mg Oral TID Kathie Dike, MD   4 mg at 11/03/17 1537  . pantoprazole (PROTONIX) EC tablet 40 mg  40 mg Oral Daily Kathie Dike, MD   40 mg at 11/03/17 1314  . prochlorperazine (COMPAZINE) tablet 10 mg  10 mg Oral Q6H PRN Kathie Dike, MD      . Derrill Memo ON 11/05/2017] scopolamine (TRANSDERM-SCOP) 1 MG/3DAYS 1.5 mg  1 patch Transdermal Q72H Memon, Jehanzeb, MD      . sodium chloride flush (NS) 0.9 % injection 3 mL  3 mL Intravenous Q12H Memon, Jehanzeb, MD      . sodium chloride flush (NS) 0.9 % injection 3 mL  3 mL Intravenous PRN Kathie Dike, MD         REVIEW OF SYSTEMS: On review of systems, the patient reports that she is doing well overall. She describes her hemoptysis as being quarter sized amounts of blood when she coughs.  This has become more frequent, but she denies any significant volume of blood loss other than what she is already described. She denies any chest pain, shortness of breath, cough, fevers, chills, night sweats, unintended weight changes. She denies any bowel or bladder disturbances, and denies abdominal pain, nausea or vomiting. She denies any new musculoskeletal or joint aches or pains. A complete review of systems is obtained and is otherwise negative.     PHYSICAL EXAM:  Wt Readings from Last 3 Encounters:  11/03/17 117 lb 1 oz (53.1 kg)  10/17/17 116 lb (52.6 kg)  09/26/17 116 lb (52.6 kg)   Temp Readings from Last 3 Encounters:  11/04/17 98.6 F (37 C) (Oral)  10/17/17 98.3 F (36.8 C) (Oral)  10/17/17 99.2 F (37.3 C) (Oral)   BP Readings from Last 3 Encounters:    11/04/17 129/75  10/17/17 126/75  10/17/17 132/84   Pulse Readings from Last 3 Encounters:  11/04/17 (!) 109  10/17/17 97  10/17/17 (!) 120   Pain Assessment Pain Score: 0-No pain/10  In general this is a chronically ill appearing caucasian female in no acute distress. She is alert and oriented x4 and appropriate throughout the examination. HEENT reveals that the patient is normocephalic, atraumatic. EOMs are intact.  Skin is  intact without any evidence of gross lesions. Cardiovascular exam reveals a regular rate and rhythm, no clicks rubs or murmurs are auscultated. Chest is clear to auscultation bilaterally.   ECOG = 2  0 - Asymptomatic (Fully active, able to carry on all predisease activities without restriction)  1 - Symptomatic but completely ambulatory (Restricted in physically strenuous activity but ambulatory and able to carry out work of a light or sedentary nature. For example, light housework, office work)  2 - Symptomatic, <50% in bed during the day (Ambulatory and capable of all self care but unable to carry out any work activities. Up and about more than 50% of waking hours)  3 - Symptomatic, >50% in bed, but not bedbound (Capable of only limited self-care, confined to bed or chair 50% or more of waking hours)  4 - Bedbound (Completely disabled. Cannot carry on any self-care. Totally confined to bed or chair)  5 - Death   Eustace Pen MM, Creech RH, Tormey DC, et al. [phone]). "Toxicity and response criteria of the [facility] Group". [place] Oncol. 5 (6): 649-55    LABORATORY DATA:  Lab Results  Component Value Date   WBC 12.3 (H) 11/03/2017   HGB 8.8 (L) 11/03/2017   HCT 28.4 (L) 11/03/2017   MCV 97.6 11/03/2017   PLT 167 11/03/2017   Lab Results  Component Value Date   NA 136 11/03/2017   K 4.2 11/03/2017   CL 102 11/03/2017   CO2 25 11/03/2017   Lab Results  Component Value Date   ALT 7 11/03/2017   AST 13 (L) 11/03/2017   ALKPHOS  124 11/03/2017   BILITOT 1.0 11/03/2017      RADIOGRAPHY: Dg Chest 2 View  Result Date: 11/03/2017 CLINICAL DATA:  72 year old female with hemoptysis. History of bladder cancer. EXAM: CHEST - 2 VIEW COMPARISON:  None. FINDINGS: Right pectoral Port-A-Cath with tip in the region of the cavoatrial junction. There is a moderate right pleural effusion with associated compressive atelectasis of the right lower lobe. Pneumonia is not excluded. Patchy area of consolidation involving the right upper lobe may represent pneumonia although underlying mass is not entirely excluded. Clinical correlation and follow-up to resolution recommended. There is a 3.2 x 3.0 cm focal rounded opacity or mass in the left upper lobe. There is trace left pleural effusion. No pneumothorax. The cardiac silhouette is within normal limits. No acute osseous pathology. IMPRESSION: 1. Moderate right and small left pleural effusions. Right lung base compressive atelectasis versus infiltrate. 2. Right upper lobe consolidation may represent pneumonia. Clinical correlation and follow-up to resolution recommended to exclude underlying mass. 3. Rounded left upper lobe focal opacity versus mass. CT may provide better evaluation. Electronically Signed   By: Anner Crete M.D.   On: 11/03/2017 03:30   Ct Chest W Contrast  Result Date: 11/03/2017 CLINICAL DATA:  72 year old female with hemoptysis. 72 year old female with hemoptysis EXAM: CT CHEST WITH CONTRAST TECHNIQUE: Multidetector CT imaging of the chest was performed during intravenous contrast administration. CONTRAST:  18mL ISOVUE-300 IOPAMIDOL (ISOVUE-300) INJECTION 61% COMPARISON:  Chest radiograph dated 11/03/2017 FINDINGS: Cardiovascular: Top-normal cardiac size. There is multi vessel coronary vascular calcification. There is a small pericardial effusion. There is mild atherosclerotic calcification of the thoracic aorta. No aneurysmal dilatation or dissection. Evaluation of the  pulmonary arteries is limited due to suboptimal visualization of the peripheral branches. No large or central pulmonary artery embolus identified. Right pectoral Port-A-Cath with tip at the cavoatrial junction. Mediastinum/Nodes: Subcarinal lymph node  measures 10 cm in short axis. Large right hilar mass extending into the mediastinum anterior to the trachea (see below). Esophagus is grossly unremarkable. A lymph node posterior to the trachea and along the right thoracic esophagus measures 6 mm in short axis. No axillary adenopathy. Lungs/Pleura: There is enlarged and irregular right upper lobe mass measuring approximately 9.7 x 6.4 x 6.4 cm. The mass extends from the right apical and right upper anterior pleural surface into the right hilar region. A 2.5 x 2.0 cm ovoid lesion anterior to the trachea in the mediastinum may represent extension of the mass into the mediastinum or a large mediastinal lymph nodes. There is mass effect and occlusion of the right upper lobe bronchus. There are areas of cavitary change within the lung parenchyma in the periphery of the mass. The right upper lobe mass extends into the right middle and right lower lobe. Patchy areas of ground-glass density in the right lower lobe adjacent to the mass may represent tumor infiltration or superimposed infection. There is a 1.8 x 2.3 cm left upper lobe solid nodule with irregular margins consistent with malignancy or metastatic disease. Additional smaller nodules throughout the left lung consistent with metastatic disease. There are areas of cavitary changes in the left upper lobe and left lower lobe. There are small bilateral pleural effusions, right greater left. No pneumothorax. The central airways are patent. Upper Abdomen: Subcentimeter splenic hypodense lesion is not characterized. The visualized upper abdomen is otherwise unremarkable. Musculoskeletal: Osteopenia with degenerative changes of the spine. No acute osseous pathology. Small  lucent lesions throughout the spine may be secondary to osteopenia or represent metastatic disease. IMPRESSION: 1. Large right hilar/suprahilar mass spanning from the right upper lobe into the right middle and right lower lobes. There is complete occlusion of the right upper lobe bronchus. 2. Multiple left lung pulmonary nodules consistent with metastatic disease. 3. Bilateral pulmonary cavitary lesions with intracavitary content which may represent necrotic debris, or fungal ball. 4. Patchy nodular and ground-glass density in the right lower lobe may represent tumor infiltration or pneumonia. 5. Small bilateral pleural effusions, right greater left. 6. Extension of the right upper lobe mass into the mediastinum anterior to the trachea. 7. No CT evidence of central pulmonary artery embolus. Electronically Signed   By: Anner Crete M.D.   On: 11/03/2017 06:31       IMPRESSION/PLAN: 1. 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 Lisbeth Renshaw has discussed her case and reviewed her imaging, he recommends proceeding with palliative radiotherapy.  We outlined the rationale for a marking start approach today so that she would simulate today and begin her treatment this afternoon.  We discussed the risks, benefits, short, and long term effects of radiotherapy, and the patient is interested in proceeding. Dr. Lisbeth Renshaw discusses the delivery and logistics of radiotherapy and anticipates a course of 2 weeks of radiotherapy.  She is in agreement with this plan. Written consent is obtained and placed in the chart, a copy was provided to the patient. 2. Goals of Care.  Patient has been on several courses of chemotherapy, and has had progressive disease despite some of these regimens.  She is still planning to continue with a aggressive approach, however I did discuss the importance of considering goals of care, and she is  agreeable to meet with palliative care while she is inpatient.  I will place a referral for this.  In a visit lasting 70 minutes,  greater than 50% of the time was spent face to face discussing her case, and coordinating the patient's care.    Carola Rhine, PAC

## 2017-11-05 ENCOUNTER — Ambulatory Visit
Admit: 2017-11-05 | Discharge: 2017-11-05 | Disposition: A | Discharge status: Home / Self Care | Payer: Medicare Other | Attending: Radiation Oncology | Admitting: Radiation Oncology

## 2017-11-05 DIAGNOSIS — Z7189 Other specified counseling: Secondary | ICD-10-CM

## 2017-11-05 DIAGNOSIS — C7800 Secondary malignant neoplasm of unspecified lung: Secondary | ICD-10-CM | POA: Insufficient documentation

## 2017-11-05 DIAGNOSIS — Z515 Encounter for palliative care: Secondary | ICD-10-CM

## 2017-11-05 LAB — BASIC METABOLIC PANEL
Anion gap: 6 (ref 5–15)
BUN: 5 mg/dL — AB (ref 8–23)
CALCIUM: 7.9 mg/dL — AB (ref 8.9–10.3)
CO2: 29 mmol/L (ref 22–32)
Chloride: 107 mmol/L (ref 98–111)
Creatinine, Ser: 0.67 mg/dL (ref 0.44–1.00)
GFR calc Af Amer: >60 mL/min (ref >60)
GLUCOSE: 97 mg/dL (ref 70–99)
POTASSIUM: 3.3 mmol/L — AB (ref 3.5–5.1)
SODIUM: 142 mmol/L (ref 135–145)

## 2017-11-05 LAB — CBC
HEMATOCRIT: 26.7 % — AB (ref 36.0–46.0)
Hemoglobin: 8.1 g/dL — ABNORMAL LOW (ref 12.0–15.0)
MCH: 30.5 pg (ref 26.0–34.0)
MCHC: 30.3 g/dL (ref 30.0–36.0)
MCV: 100.4 fL — ABNORMAL HIGH (ref 78.0–100.0)
PLATELETS: 150 10*3/uL (ref 150–400)
RBC: 2.66 MIL/uL — ABNORMAL LOW (ref 3.87–5.11)
RDW: 19.8 % — AB (ref 11.5–15.5)
WBC: 7.6 10*3/uL (ref 4.0–10.5)

## 2017-11-05 LAB — MAGNESIUM: MAGNESIUM: 1.8 mg/dL (ref 1.7–2.4)

## 2017-11-05 LAB — PREPARE RBC (CROSSMATCH)

## 2017-11-05 MED ORDER — PROCHLORPERAZINE MALEATE 10 MG PO TABS: 10.0000 mg | ORAL_TABLET | Freq: Four times a day (QID) | ORAL | Status: DC | PRN

## 2017-11-05 MED ORDER — SALINE SPRAY 0.65 % NA SOLN
1.0000 | NASAL | Status: DC | PRN
  Administered 2017-11-05: 1 via NASAL
  Filled 2017-11-05: qty 44

## 2017-11-05 MED ORDER — SODIUM CHLORIDE 0.9% IV SOLUTION
Freq: Once | INTRAVENOUS | Status: AC
  Administered 2017-11-05: 18:00:00 via INTRAVENOUS

## 2017-11-05 MED ORDER — BENZONATATE 100 MG PO CAPS
100.0000 mg | ORAL_CAPSULE | Freq: Two times a day (BID) | ORAL | Status: DC | PRN
  Administered 2017-11-05: 100 mg via ORAL
  Filled 2017-11-05: qty 1

## 2017-11-05 NOTE — Consult Note (Signed)
Consultation Note Date: 11/05/2017   Patient Name: Jodi [name] [name]  DOB: [birth year]  MRN: [id number]  Age / Sex: 72 y.o., female  PCP: Joseph Art, MD Referring Physician: Damita Lack, MD  Reason for Consultation: Establishing goals of care  HPI/Patient Profile: 72 y.o. female  with past medical history of metastatic bladder cancer (lung mets) admitted on 11/03/2017 with hemoptysis.  She has been following with Dr. Delton Coombes at [facility] for oncologic care.  Radiation oncology has evaluated her and plan for 2 weeks of radiation therapy.       Clinical Assessment and Goals of Care: I met today with Jodi [name] [name].  I introduced palliative care as specialized medical care for people living with serious illness. It focuses on providing relief from the symptoms and stress of a serious illness. The goal is to improve quality of life for both the patient and the family.  She reports that her doctors have been doing a good job explaining things to her, and she reports continuing to feel weaker.  Reports that she has been relying on God and her daughter Jodi [name] [name]) for strength.  SUMMARY OF RECOMMENDATIONS   - Full Code/Funn Scope treatment.  Reports needing to discuss with her oncologist prior to further consideration.  - Will plan to attempt follow-up in AM when daughter is present.  Code Status/Advance Care Planning:  Full code   Symptom Management:   Hemoptysis- Continue radiation  Palliative Prophylaxis:   Bowel Regimen, Delirium Protocol and Frequent Pain Assessment  Additional Recommendations (Limitations, Scope, Preferences):  Full Scope Treatment  Psycho-social/Spiritual:   Desire for further Chaplaincy support:no  Additional Recommendations: Caregiving  Support/Resources  Prognosis:   Unable to determine  Discharge Planning: Home      Primary Diagnoses: Present on  Admission: . Urothelial cancer ([place]) . Hemoptysis . HTN (hypertension) . Anemia in other chronic diseases classified elsewhere . GERD (gastroesophageal reflux disease)   I have reviewed the medical record, interviewed the patient and family, and examined the patient. The following aspects are pertinent.  Past Medical History:  Diagnosis Date  . Bladder cancer ([place])   . GERD (gastroesophageal reflux disease)   . Hypertension   . Hypomagnesemia   . Renal failure    Social History   Socioeconomic History  . Marital status: Married    Spouse name: Not on file  . Number of children: Not on file  . Years of education: Not on file  . Highest education level: Not on file  Occupational History  . Not on file  Social Needs  . Financial resource strain: Not on file  . Food insecurity:    Worry: Not on file    Inability: Not on file  . Transportation needs:    Medical: Not on file    Non-medical: Not on file  Tobacco Use  . Smoking status: Never Smoker  . Smokeless tobacco: Never Used  Substance and Sexual Activity  . Alcohol use: Not Currently  . Drug use: Not Currently  .  Sexual activity: Not on file  Lifestyle  . Physical activity:    Days per week: Not on file    Minutes per session: Not on file  . Stress: Not on file  Relationships  . Social connections:    Talks on phone: Not on file    Gets together: Not on file    Attends religious service: Not on file    Active member of club or organization: Not on file    Attends meetings of clubs or organizations: Not on file    Relationship status: Not on file  Other Topics Concern  . Not on file  Social History Narrative  . Not on file   History reviewed. No pertinent family history. Scheduled Meds: . sodium chloride   Intravenous Once  . diltiazem  240 mg Oral Daily  . magic mouthwash  5 mL Oral QID   And  . lidocaine  5 mL Mouth/Throat QID  . meclizine  25 mg Oral BID  . pantoprazole  40 mg Oral Daily  .  scopolamine  1 patch Transdermal Q72H  . sodium chloride flush  3 mL Intravenous Q12H   Continuous Infusions: . sodium chloride 250 mL (11/05/17 1228)  . ampicillin-sulbactam (UNASYN) IV Stopped (11/05/17 1807)   PRN Meds:.sodium chloride, acetaminophen **OR** acetaminophen, ALPRAZolam, benzonatate, guaiFENesin-codeine, ondansetron, prochlorperazine, sodium chloride, sodium chloride flush Medications Prior to Admission:  Prior to Admission medications   Medication Sig Start Date End Date Taking? Authorizing Provider  ALPRAZolam (XANAX PO) Take 0.25 mg by mouth as needed.    Yes [provider]  amLODipine (NORVASC) 5 MG tablet Take 1 tablet (5 mg total) by mouth daily. 09/26/17  Yes Derek Jack, MD  cholecalciferol (VITAMIN D) 400 units TABS tablet Take 400 Units by mouth daily.   Yes [provider]  Cyanocobalamin (VITAMIN B 12 PO) Take 1 mg by mouth daily.   Yes [provider]  dexamethasone (DECADRON) 4 MG tablet Take 1 tablet (4 mg total) by mouth 2 (two) times daily with a meal. Take for 2-3 days after each chemo treatment 09/06/17  Yes Derek Jack, MD  diltiazem (CARDIZEM CD) 240 MG 24 hr capsule 1 TABLET AT THE SAME TIME EACH DAY ONCE A DAY ORALLY 07/24/17  Yes [provider]  DOCEtaxel (TAXOTERE IV) Inject into the vein.   Yes [provider]  guaiFENesin-codeine 100-10 MG/5ML syrup TAKE 10 ML BY MOUTH 4 TIMES A DAY AS NEEDED COUGH 09/26/17  Yes Derek Jack, MD  lansoprazole (PREVACID) 30 MG capsule Take 30 mg by mouth daily at 12 noon.   Yes [provider]  magic mouthwash w/lidocaine SOLN SWISH AND SPIT 5MLS 4 TIMES A DAY 10/17/17  Yes Derek Jack, MD  magnesium oxide (MAG-OX) 400 MG tablet Take 1 tablet by mouth 2 (two) times daily.  07/31/17  Yes [provider]  meclizine (ANTIVERT) 25 MG tablet Take 25 mg by mouth 2 (two) times daily.    Yes [provider]  megestrol (MEGACE)  40 MG/ML suspension Take by mouth 2 (two) times daily.   Yes [provider]  ondansetron (ZOFRAN-ODT) 4 MG disintegrating tablet Take 4 mg by mouth 3 (three) times daily. 06/07/17  Yes [provider]  prochlorperazine (COMPAZINE) 10 MG tablet Take 10 mg by mouth every 6 (six) hours as needed for nausea or vomiting.   Yes [provider]  Ramucirumab (CYRAMZA IV) Inject into the vein.   Yes [provider]  TRANSDERM-SCOP,  1.5 MG, 1 MG/3DAYS APPLY 1 PATCH EVERY 72 HOURS 08/01/17  Yes [provider]  Misc. Devices MISC 15 mLs by Does not apply route every 3 (three) hours as needed. Maalox+ Lidocaine mix one to one. 1 tablespoon swish and swallow or swish and spit q 3-4 hours prn for mouth sores. Patient not taking: Reported on 11/03/2017 09/13/17   Derek Jack, MD   Allergies  Allergen Reactions  . Lortab [Hydrocodone-Acetaminophen]    Review of Systems  Constitutional: Positive for activity change, appetite change and fatigue.  Respiratory: Positive for cough.   Musculoskeletal: Positive for back pain.  Neurological: Positive for weakness.  Psychiatric/Behavioral: Positive for sleep disturbance.   Physical Exam General: Alert, awake, in no acute distress.  Weak/frail.  HEENT: No bruits, no goiter, no JVD Heart: Regular rate and rhythm. No murmur appreciated. Lungs: Diminished air movement, clear Abdomen: Soft, nontender, nondistended, positive bowel sounds.  Ext: No significant edema Skin: Warm and dry Neuro: Grossly intact, nonfocal.  Vital Signs: BP 132/77 (BP Location: Left Arm)   Pulse (!) 108   Temp 98.5 F (36.9 C) (Oral)   Resp 18   Ht 5' (1.524 m)   Wt 53.1 kg (117 lb 1 oz)   SpO2 94%   BMI 22.86 kg/m  Pain Scale: 0-10   Pain Score: 0-No pain   SpO2: SpO2: 94 % O2 Device:SpO2: 94 % O2 Flow Rate: .O2 Flow Rate (L/min): 2 L/min  IO: Intake/output summary:   Intake/Output Summary (Last 24 hours) at 11/05/2017  1825 Last data filed at 11/05/2017 1300 Gross per 24 hour  Intake 1074 ml  Output 150 ml  Net 924 ml    LBM: Last BM Date: 11/04/17 Baseline Weight: Weight: 52.6 kg (116 lb) Most recent weight: Weight: 53.1 kg (117 lb 1 oz)     Palliative Assessment/Data:   Flowsheet Rows     Most Recent Value  Intake Tab  Referral Department  Hospitalist  Unit at Time of Referral  Med/Surg Unit  Palliative Care Primary Diagnosis  Cancer  Date Notified  11/04/17  Palliative Care Type  New Palliative care  Reason for referral  Clarify Goals of Care  Date of Admission  11/03/17  Date first seen by Palliative Care  11/05/17  # of days Palliative referral response time  1 Day(s)  # of days IP prior to Palliative referral  1  Clinical Assessment  Palliative Performance Scale Score  40%  Psychosocial & Spiritual Assessment  Palliative Care Outcomes  Patient/Family meeting held?  Yes  Who was at the meeting?  Patient  Palliative Care Outcomes  Clarified goals of care     Time In: 1705 Time Out: 1825 Time Total: 80 Greater than 50%  of this time was spent counseling and coordinating care related to the above assessment and plan.  Signed by: Micheline Rough, MD   Please contact Palliative Medicine Team phone at [phone] for questions and concerns.  For individual provider: See Shea Evans

## 2017-11-05 NOTE — Progress Notes (Signed)
Spoke with Dr Delton Coombes regarding the patient, he recommends transfusing her 1unit of PRBC to help her anemia which could be contributing to her dyspnea with exertion. Will transfuse 1 Unit.  I have offered family member and patient home health but they would like to think about it.   At this time will tx 1 unit. Monitor her overnight and likely discharge her tomorrow.

## 2017-11-05 NOTE — Progress Notes (Signed)
One Unit blood started as ordered, pt tol well, VSs, will cont to monitor and update oncoming nurse. SRP, RN

## 2017-11-05 NOTE — Progress Notes (Signed)
  Radiation Oncology         [phone] ________________________________  Name: DONNARAE RAE MRN: [id number]  Date: 11/04/2017  DOB: [birth year]  SIMULATION AND TREATMENT PLANNING NOTE  DIAGNOSIS:     ICD-10-CM   1. 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 Malignant neoplasm metastatic to right lung [facility]) C78.01      Site:  chest  NARRATIVE:  The patient was brought to the [place].  Identity was confirmed.  All relevant records and images related to the planned course of therapy were reviewed.   Written consent to proceed with treatment was confirmed which was freely given after reviewing the details related to the planned course of therapy had been reviewed with the patient.  Then, the patient was set-up in a stable reproducible  supine position for radiation therapy.  CT images were obtained.  Surface markings were placed.    Medically necessary complex treatment device(s) for immobilization:  Customized vac-lock bag.   The CT images were loaded into the planning software.  Then the target and avoidance structures were contoured.  Treatment planning then occurred.  The radiation prescription was entered and confirmed.  A total of 3 complex treatment devices were fabricated which relate to the designed radiation treatment fields. Additional reduced fields will be used as necessary to improve the dose homogeneity of the plan. Each of these customized fields/ complex treatment devices will be used on a daily basis during the radiation course. I have requested : 3D Simulation  I have requested a DVH of the following structures: target volume, spinal cord, lungs, heart.   The patient will undergo daily image guidance to ensure accurate localization of the target, and adequate minimize dose to the normal surrounding structures in close proximity to the target.   PLAN:  The patient will receive 30 Gy in 10  fractions.    ________________________________   Jodelle Gross, MD, PhD

## 2017-11-05 NOTE — Progress Notes (Signed)
PROGRESS NOTE    SRI CLEGG  FBP:[id number] DOB: [birth year] DOA: 11/03/2017 PCP: Joseph Art, MD   Brief Narrative:  72 year old female with a history of metastatic bladder cancer, essential hypertension, GERD came to the hospital with complaints of hemoptysis that started 2 days prior to her admission and had slightly worsened.  She initially presented to [facility] hospital and on the CT chest she was found to have extensive pulmonary metastatic disease with one mass occluding her right upper bronchus.  Case was discussed with on-call radiation oncology and her oncologist who recommended transferring the patient to [facility] for further care and management.Currently she is on radiation.    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  Metastatic bladder cancer to lungs with right upper bronchus occluding mass, status post nephrectomy - Radiation oncology recommended palliative radiotherapy, status post first radiation this morning.  Palliative care team has been consulted to assist given her advanced/aggressive cancer.  At this point we will continue to provide her supportive care, supplemental oxygen as needed.  Pain control. Still awaiting final recommendations from Oncology. Paged Dr Delton Coombes, awaiting response. -Currently empirically on Unasyn  Anemia chronic disease -Closely monitor hemoglobin. Will tx if needed.   GERD -Continue Protonix 40 mg daily  Essential hypertension -Continue Cardizem 240 mg daily  Generalized weakness and deconditioning- supportive care, wouldn't benefit from rehab at this time and would work on transitioning her home when medically better.   Offered home health but patient  doesn't want it at this time.   DVT prophylaxis: SCDs Code Status: Full code Family Communication: Daughter at bedside Disposition Plan: Maintain hosp stay until cleared by Oncology and she is feeling stronger.   Consultants:  Radiation oncology Palliative care  Procedures:   None  Antimicrobials:   Unasyn 6/30   Subjective: Still has some hemoptysis. No other complaints.   Review of Systems Otherwise negative except as per HPI, including: General = no fevers, chills, dizziness, malaise, fatigue HEENT/EYES = negative for pain, redness, loss of vision, double vision, blurred vision, loss of hearing, sore throat, hoarseness, dysphagia Cardiovascular= negative for chest pain, palpitation, murmurs, lower extremity swelling Respiratory/lungs= negative for shortness of breath,  wheezing, mucus production Gastrointestinal= negative for nausea, vomiting,, abdominal pain, melena, hematemesis Genitourinary= negative for Dysuria, Hematuria, Change in Urinary Frequency MSK = Negative for arthralgia, myalgias, Back Pain, Joint swelling  Neurology= Negative for headache, seizures, numbness, tingling  Psychiatry= Negative for anxiety, depression, suicidal and homocidal ideation Allergy/Immunology= Medication/Food allergy as listed  Skin= Negative for Rash, lesions, ulcers, itching   Objective: Vitals:   11/04/17 0547 11/04/17 1342 11/04/17 2128 11/05/17 0657  BP: 129/75 132/76 134/73 (!) 143/73  Pulse: (!) 109 (!) 114 (!) 118 (!) 108  Resp: 16 20 14 15   Temp: 98.6 F (37 C) 98.6 F (37 C) 98.9 F (37.2 C) 98.4 F (36.9 C)  TempSrc: Oral Oral Oral Oral  SpO2: 99% 96% 96% 96%  Weight:      Height:        Intake/Output Summary (Last 24 hours) at 11/05/2017 1311 Last data filed at 11/05/2017 0600 Gross per 24 hour  Intake 1165 ml  Output 150 ml  Net 1015 ml   [employer]  11/03/17 0251 11/03/17 2155  Weight: 52.6 kg (116 lb) 53.1 kg (117 lb 1 oz)     Examination: Constitutional: NAD, calm, comfortable; generallly ill, frail appearing.  Eyes: PERRL, lids and conjunctivae normal ENMT: Mucous membranes are moist. Posterior pharynx clear of any exudate or lesions.Normal dentition.  Neck: normal, supple, no masses, no thyromegaly Respiratory: clear to auscultation bilaterally, no wheezing, no crackles. Normal respiratory effort. No accessory muscle use.  Cardiovascular: Regular rate and rhythm, no murmurs / rubs / gallops. No extremity edema. 2+ pedal pulses. No carotid bruits.  Abdomen: no tenderness, no masses palpated. No hepatosplenomegaly. Bowel sounds positive.  Musculoskeletal: no clubbing / cyanosis. No joint deformity upper and lower extremities. Good ROM, no contractures. Normal muscle tone.  Skin: no rashes, lesions, ulcers. No induration Neurologic: CN 2-12 grossly intact. Sensation intact, DTR normal. Strength 4+/5 in all 4.  Psychiatric: Normal judgment and insight. Alert and oriented x 3. Normal mood.    Data Reviewed:   CBC: Recent Labs  Lab 11/03/17 0440 11/05/17 0830  WBC 12.3* 7.6  NEUTROABS 11.0*  --   HGB 8.8* 8.1*  HCT 28.4* 26.7*  MCV 97.6 100.4*  PLT 167 448   Basic Metabolic Panel: Recent Labs  Lab 11/03/17 0440 11/05/17 0830  NA 136 142  K 4.2 3.3*  CL 102 107  CO2 25 29  GLUCOSE 104* 97  BUN 10 5*  CREATININE 0.75 0.67  CALCIUM 8.5* 7.9*  MG  --  1.8   GFR: Estimated Creatinine Clearance: 45 mL/min (by C-G formula based on SCr of 0.67 mg/dL). Liver Function Tests: Recent Labs  Lab 11/03/17 0440  AST 13*  ALT 7  ALKPHOS 124  BILITOT 1.0  PROT 6.2*  ALBUMIN 3.1*   No results for input(s): LIPASE, AMYLASE in the last 168 hours. No results for input(s): AMMONIA in the last 168 hours. Coagulation Profile: Recent Labs  Lab 11/03/17 0447  INR 1.11   Cardiac Enzymes: No results for input(s): CKTOTAL, CKMB, CKMBINDEX, TROPONINI in the last 168 hours. BNP (last 3 results) No  results for input(s): PROBNP in the last 8760 hours. HbA1C: No results for input(s): HGBA1C in the last 72 hours. CBG: No results for input(s): GLUCAP in the last 168 hours. Lipid Profile: No results for input(s): CHOL, HDL, LDLCALC, TRIG, CHOLHDL, LDLDIRECT in the last 72 hours. Thyroid Function Tests: No results for input(s): TSH, T4TOTAL, FREET4, T3FREE, THYROIDAB in the last 72 hours. Anemia Panel: No results for input(s): VITAMINB12, FOLATE, FERRITIN, TIBC, IRON, RETICCTPCT in the last 72 hours. Sepsis Labs: No results for input(s): PROCALCITON, LATICACIDVEN in the last 168 hours.  No results found for this or any previous visit (from the past 240 hour(s)).       Radiology Studies: No results found.      Scheduled Meds: . diltiazem  240 mg Oral Daily  . magic mouthwash  5 mL Oral QID   And  . lidocaine  5 mL Mouth/Throat QID  . meclizine  25 mg Oral BID  . pantoprazole  40 mg Oral Daily  . scopolamine  1 patch Transdermal Q72H  . sodium chloride flush  3 mL Intravenous Q12H   Continuous Infusions: . sodium chloride 250 mL (11/05/17 1228)  . ampicillin-sulbactam (UNASYN) IV 3 g (11/05/17 1228)     LOS: 1 day    I have spent 30 minutes face to face with the patient and on the ward discussing the patients care, assessment, plan and disposition with other  care givers. >50% of the time was devoted counseling the patient about the risks and benefits of treatment and coordinating care.     Ly Bacchi Arsenio Loader, MD [facility] Pager [phone]   If 7PM-7AM, please contact night-coverage [email or web address] Password [email or web address] 11/05/2017, 1:11 PM

## 2017-11-06 ENCOUNTER — Ambulatory Visit
Admit: 2017-11-06 | Discharge: 2017-11-06 | Disposition: A | Discharge status: Home / Self Care | Payer: Medicare Other | Attending: Radiation Oncology | Admitting: Radiation Oncology

## 2017-11-06 ENCOUNTER — Ambulatory Visit (HOSPITAL_COMMUNITY): Payer: Medicare Other

## 2017-11-06 DIAGNOSIS — C7800 Secondary malignant neoplasm of unspecified lung: Secondary | ICD-10-CM

## 2017-11-06 DIAGNOSIS — C799 Secondary malignant neoplasm of unspecified site: Secondary | ICD-10-CM

## 2017-11-06 DIAGNOSIS — R042 Hemoptysis: Secondary | ICD-10-CM

## 2017-11-06 DIAGNOSIS — K219 Gastro-esophageal reflux disease without esophagitis: Secondary | ICD-10-CM

## 2017-11-06 DIAGNOSIS — D638 Anemia in other chronic diseases classified elsewhere: Secondary | ICD-10-CM

## 2017-11-06 DIAGNOSIS — C679 Malignant neoplasm of bladder, unspecified: Secondary | ICD-10-CM

## 2017-11-06 DIAGNOSIS — I10 Essential (primary) hypertension: Secondary | ICD-10-CM

## 2017-11-06 LAB — TYPE AND SCREEN
ABO/RH(D): A POS
Antibody Screen: NEGATIVE
Unit division: 0

## 2017-11-06 LAB — CBC
HCT: 30.4 % — ABNORMAL LOW (ref 36.0–46.0)
HEMOGLOBIN: 9.3 g/dL — AB (ref 12.0–15.0)
MCH: 30.1 pg (ref 26.0–34.0)
MCHC: 30.6 g/dL (ref 30.0–36.0)
MCV: 98.4 fL (ref 78.0–100.0)
PLATELETS: 146 10*3/uL — AB (ref 150–400)
RBC: 3.09 MIL/uL — AB (ref 3.87–5.11)
RDW: 19.1 % — ABNORMAL HIGH (ref 11.5–15.5)
WBC: 10 10*3/uL (ref 4.0–10.5)

## 2017-11-06 LAB — BASIC METABOLIC PANEL
Anion gap: 7 (ref 5–15)
BUN: 5 mg/dL — ABNORMAL LOW (ref 8–23)
CHLORIDE: 107 mmol/L (ref 98–111)
CO2: 29 mmol/L (ref 22–32)
CREATININE: 0.58 mg/dL (ref 0.44–1.00)
Calcium: 8 mg/dL — ABNORMAL LOW (ref 8.9–10.3)
GFR calc non Af Amer: >60 mL/min (ref >60)
Glucose, Bld: 104 mg/dL — ABNORMAL HIGH (ref 70–99)
POTASSIUM: 3.5 mmol/L (ref 3.5–5.1)
SODIUM: 143 mmol/L (ref 135–145)

## 2017-11-06 LAB — BPAM RBC
Blood Product Expiration Date: 201907262359
ISSUE DATE / TIME: 201907021833
Unit Type and Rh: 6200

## 2017-11-06 LAB — MAGNESIUM: MAGNESIUM: 1.7 mg/dL (ref 1.7–2.4)

## 2017-11-06 MED ORDER — BENZONATATE 100 MG PO CAPS: 100.0000 mg | ORAL_CAPSULE | Freq: Two times a day (BID) | ORAL | 0 refills | Status: AC | PRN

## 2017-11-06 MED ORDER — AMOXICILLIN-POT CLAVULANATE 875-125 MG PO TABS: 1.0000 | ORAL_TABLET | Freq: Two times a day (BID) | ORAL | 0 refills | Status: AC

## 2017-11-06 MED ORDER — HEPARIN SOD (PORK) LOCK FLUSH 100 UNIT/ML IV SOLN
500.0000 [IU] | INTRAVENOUS | Status: AC | PRN
  Administered 2017-11-06: 500 [IU]

## 2017-11-06 NOTE — Discharge Summary (Signed)
Physician Discharge Summary  Jodi [name] HAL:[id number] DOB: 11-May-1945 DOA: 11/03/2017  PCP: Joseph Art, MD  Admit date: 11/03/2017 Discharge date: 11/06/2017  Admitted From: Home.  Disposition:  Home   Recommendations for Outpatient Follow-up:  1. Follow up with PCP in 1-2 weeks 2. Please obtain BMP/CBC in one week 3. Please follow up with oncology as scheduled.   Home Health:Yes Equipment/Devices:2 lit of [place] oxygen.   Discharge Condition: Guarded.  CODE STATUS:full code.  Diet recommendation: Heart Healthy Brief/Interim Summary: 72 year old female with a history of metastatic bladder cancer, essential hypertension, GERD came to the hospital with complaints of hemoptysis that started 2 days prior to her admission and had slightly worsened.  She initially presented to anything hospital and on the CT chest she was found to have extensive pulmonary metastatic disease with one mass occluding her right upper bronchus.  Case was discussed with on-call radiation oncology and her oncologist who recommended transferring the patient to [facility] for further care and management.Currently she is on radiation treatment started this week and plan to continue to complete a 10 day course. Her oncologist also recommended one unit prbc transfusion. Her H&h stable and her hemoptysis improved.     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  Metastatic bladder cancer to lungs with right upper bronchus occluding mass, status post nephrectomy - Radiation oncology recommended palliative radiotherapy, status post first radiation this morning.  Palliative care team has been consulted to assist given her advanced/aggressive cancer.   At this point we will continue to provide her supportive care, supplemental oxygen as needed.  Pain control. Paged Dr Delton Coombes,  Recommended 1 unit of prbc transfusion and plan to discharge the patient on oral antibiotics for possible bronchitis.    Anemia chronic disease -1 UNIT prbc transfusion ordered. Hemoptysis improved and H&H stable.   GERD -Continue Protonix 40 mg daily  Essential hypertension -Continue Cardizem 240 mg daily  Generalized weakness and deconditioning- supportive care, home health PT and RN ordered on discharge.     Discharge Instructions  Discharge Instructions    Diet - low sodium heart healthy   Complete by:  As directed    Discharge instructions   Complete by:  As directed    Follow up with oncology as recommended.     Allergies as of 11/06/2017      Reactions   Lortab [hydrocodone-acetaminophen]       Medication List    STOP taking these medications   Misc. Devices Misc     TAKE these medications   amLODipine 5 MG tablet Commonly known as:  NORVASC Take 1 tablet (5 mg total) by mouth daily.   amoxicillin-clavulanate 875-125 MG tablet Commonly known as:  AUGMENTIN Take 1 tablet by mouth every 12 (twelve) hours for 7 days.   benzonatate 100 MG capsule Commonly known as:  TESSALON Take 1 capsule (100 mg total) by mouth 2 (two) times daily as needed for cough (Administer if not controlled by Guafensin w/codiene).   cholecalciferol 400 units Tabs tablet Commonly known as:  VITAMIN D Take 400 Units by mouth daily.   CYRAMZA IV Inject into the vein.   dexamethasone 4 MG tablet Commonly known as:  DECADRON Take 1 tablet (4 mg total)  by mouth 2 (two) times daily with a meal. Take for 2-3 days after each chemo treatment   diltiazem 240 MG 24 hr capsule Commonly known as:  CARDIZEM CD 1 TABLET AT THE SAME TIME EACH DAY ONCE A DAY ORALLY   guaiFENesin-codeine 100-10 MG/5ML syrup TAKE 10 ML BY MOUTH 4 TIMES A DAY AS NEEDED COUGH    lansoprazole 30 MG capsule Commonly known as:  PREVACID Take 30 mg by mouth daily at 12 noon.   magic mouthwash w/lidocaine Soln SWISH AND SPIT 5MLS 4 TIMES A DAY   magnesium oxide 400 MG tablet Commonly known as:  MAG-OX Take 1 tablet by mouth 2 (two) times daily.   meclizine 25 MG tablet Commonly known as:  ANTIVERT Take 25 mg by mouth 2 (two) times daily.   megestrol 40 MG/ML suspension Commonly known as:  MEGACE Take by mouth 2 (two) times daily.   ondansetron 4 MG disintegrating tablet Commonly known as:  ZOFRAN-ODT Take 4 mg by mouth 3 (three) times daily.   prochlorperazine 10 MG tablet Commonly known as:  COMPAZINE Take 10 mg by mouth every 6 (six) hours as needed for nausea or vomiting.   TAXOTERE IV Inject into the vein.   TRANSDERM-SCOP (1.5 MG) 1 MG/3DAYS Generic drug:  scopolamine APPLY 1 PATCH EVERY 72 HOURS   VITAMIN B 12 PO Take 1 mg by mouth daily.   XANAX PO Take 0.25 mg by mouth as needed.            Durable Medical Equipment  (From admission, onward)        Start     Ordered   11/06/17 1637  For home use only DME oxygen  Once    Question Answer Comment  Mode or (Route) Nasal cannula   Liters per Minute 2   Frequency Continuous (stationary and portable oxygen unit needed)   Oxygen conserving device Yes   Oxygen delivery system Gas      11/06/17 1637     Follow-up Information    Joseph Art, MD Follow up.   Specialty:  Internal Medicine Why:  check CXR in one week.  Contact information: [address] [place] [address] [phone]          Allergies  Allergen Reactions  . Lortab [Hydrocodone-Acetaminophen]     Consultations:  Palliative care.    Procedures/Studies: Dg Chest 2 View  Result Date: 11/03/2017 CLINICAL DATA:  72 year old female with hemoptysis. History of bladder cancer. EXAM: CHEST - 2 VIEW COMPARISON:  None. FINDINGS: Right pectoral Port-A-Cath with tip in the region  of the cavoatrial junction. There is a moderate right pleural effusion with associated compressive atelectasis of the right lower lobe. Pneumonia is not excluded. Patchy area of consolidation involving the right upper lobe may represent pneumonia although underlying mass is not entirely excluded. Clinical correlation and follow-up to resolution recommended. There is a 3.2 x 3.0 cm focal rounded opacity or mass in the left upper lobe. There is trace left pleural effusion. No pneumothorax. The cardiac silhouette is within normal limits. No acute osseous pathology. IMPRESSION: 1. Moderate right and small left pleural effusions. Right lung base compressive atelectasis versus infiltrate. 2. Right upper lobe consolidation may represent pneumonia. Clinical correlation and follow-up to resolution recommended to exclude underlying mass. 3. Rounded left upper lobe focal opacity versus mass. CT may provide better evaluation. Electronically Signed   By: Anner Crete M.D.   On: 11/03/2017 03:30   Ct Chest  W Contrast  Result Date: 11/03/2017 CLINICAL DATA:  72 year old female with hemoptysis. 72 year old female with hemoptysis EXAM: CT CHEST WITH CONTRAST TECHNIQUE: Multidetector CT imaging of the chest was performed during intravenous contrast administration. CONTRAST:  63mL ISOVUE-300 IOPAMIDOL (ISOVUE-300) INJECTION 61% COMPARISON:  Chest radiograph dated 11/03/2017 FINDINGS: Cardiovascular: Top-normal cardiac size. There is multi vessel coronary vascular calcification. There is a small pericardial effusion. There is mild atherosclerotic calcification of the thoracic aorta. No aneurysmal dilatation or dissection. Evaluation of the pulmonary arteries is limited due to suboptimal visualization of the peripheral branches. No large or central pulmonary artery embolus identified. Right pectoral Port-A-Cath with tip at the cavoatrial junction. Mediastinum/Nodes: Subcarinal lymph node measures 10 cm in short axis. Large right  hilar mass extending into the mediastinum anterior to the trachea (see below). Esophagus is grossly unremarkable. A lymph node posterior to the trachea and along the right thoracic esophagus measures 6 mm in short axis. No axillary adenopathy. Lungs/Pleura: There is enlarged and irregular right upper lobe mass measuring approximately 9.7 x 6.4 x 6.4 cm. The mass extends from the right apical and right upper anterior pleural surface into the right hilar region. A 2.5 x 2.0 cm ovoid lesion anterior to the trachea in the mediastinum may represent extension of the mass into the mediastinum or a large mediastinal lymph nodes. There is mass effect and occlusion of the right upper lobe bronchus. There are areas of cavitary change within the lung parenchyma in the periphery of the mass. The right upper lobe mass extends into the right middle and right lower lobe. Patchy areas of ground-glass density in the right lower lobe adjacent to the mass may represent tumor infiltration or superimposed infection. There is a 1.8 x 2.3 cm left upper lobe solid nodule with irregular margins consistent with malignancy or metastatic disease. Additional smaller nodules throughout the left lung consistent with metastatic disease. There are areas of cavitary changes in the left upper lobe and left lower lobe. There are small bilateral pleural effusions, right greater left. No pneumothorax. The central airways are patent. Upper Abdomen: Subcentimeter splenic hypodense lesion is not characterized. The visualized upper abdomen is otherwise unremarkable. Musculoskeletal: Osteopenia with degenerative changes of the spine. No acute osseous pathology. Small lucent lesions throughout the spine may be secondary to osteopenia or represent metastatic disease. IMPRESSION: 1. Large right hilar/suprahilar mass spanning from the right upper lobe into the right middle and right lower lobes. There is complete occlusion of the right upper lobe bronchus. 2.  Multiple left lung pulmonary nodules consistent with metastatic disease. 3. Bilateral pulmonary cavitary lesions with intracavitary content which may represent necrotic debris, or fungal ball. 4. Patchy nodular and ground-glass density in the right lower lobe may represent tumor infiltration or pneumonia. 5. Small bilateral pleural effusions, right greater left. 6. Extension of the right upper lobe mass into the mediastinum anterior to the trachea. 7. No CT evidence of central pulmonary artery embolus. Electronically Signed   By: Anner Crete M.D.   On: 11/03/2017 06:31       Subjective: Wants to go home and follow up with oncology. She reports her hemoptysis improved . No new complaints.   Discharge Exam: Vitals:   11/06/17 1338 11/06/17 1555  BP: 134/74   Pulse: (!) 104 (!) 108  Resp: 20   Temp: 97.9 F (36.6 C)   SpO2: 95% 95%   Vitals:   11/05/17 2159 11/06/17 0506 11/06/17 1338 11/06/17 1555  BP: (!) 150/85 (!) 143/75 134/74  Pulse: (!) 121 (!) 108 (!) 104 (!) 108  Resp: 20 15 20    Temp: 98.9 F (37.2 C) 98.1 F (36.7 C) 97.9 F (36.6 C)   TempSrc: Oral Oral Oral   SpO2: 96% 94% 95% 95%  Weight:      Height:        General: Pt is alert, awake, not in acute distress Cardiovascular: RRR, S1/S2 +, no rubs, no gallops Respiratory: CTA bilaterally, no wheezing, no rhonchi Abdominal: Soft, NT, ND, bowel sounds + Extremities: no edema, no cyanosis    The results of significant diagnostics from this hospitalization (including imaging, microbiology, ancillary and laboratory) are listed below for reference.     Microbiology: No results found for this or any previous visit (from the past 240 hour(s)).   Labs: BNP (last 3 results) No results for input(s): BNP in the last 8760 hours. Basic Metabolic Panel: Recent Labs  Lab 11/03/17 0440 11/05/17 0830 11/06/17 0436  NA 136 142 143  K 4.2 3.3* 3.5  CL 102 107 107  CO2 25 29 29   GLUCOSE 104* 97 104*  BUN 10 5*  5*  CREATININE 0.75 0.67 0.58  CALCIUM 8.5* 7.9* 8.0*  MG  --  1.8 1.7   Liver Function Tests: Recent Labs  Lab 11/03/17 0440  AST 13*  ALT 7  ALKPHOS 124  BILITOT 1.0  PROT 6.2*  ALBUMIN 3.1*   No results for input(s): LIPASE, AMYLASE in the last 168 hours. No results for input(s): AMMONIA in the last 168 hours. CBC: Recent Labs  Lab 11/03/17 0440 11/05/17 0830 11/06/17 0436  WBC 12.3* 7.6 10.0  NEUTROABS 11.0*  --   --   HGB 8.8* 8.1* 9.3*  HCT 28.4* 26.7* 30.4*  MCV 97.6 100.4* 98.4  PLT 167 150 146*   Cardiac Enzymes: No results for input(s): CKTOTAL, CKMB, CKMBINDEX, TROPONINI in the last 168 hours. BNP: Invalid input(s): POCBNP CBG: No results for input(s): GLUCAP in the last 168 hours. D-Dimer No results for input(s): DDIMER in the last 72 hours. Hgb A1c No results for input(s): HGBA1C in the last 72 hours. Lipid Profile No results for input(s): CHOL, HDL, LDLCALC, TRIG, CHOLHDL, LDLDIRECT in the last 72 hours. Thyroid function studies No results for input(s): TSH, T4TOTAL, T3FREE, THYROIDAB in the last 72 hours.  Invalid input(s): FREET3 Anemia work up No results for input(s): VITAMINB12, FOLATE, FERRITIN, TIBC, IRON, RETICCTPCT in the last 72 hours. Urinalysis    Component Value Date/Time   COLORURINE YELLOW 11/04/2017 0835   APPEARANCEUR HAZY (A) 11/04/2017 0835   LABSPEC 1.021 11/04/2017 0835   PHURINE 5.0 11/04/2017 0835   GLUCOSEU NEGATIVE 11/04/2017 0835   HGBUR MODERATE (A) 11/04/2017 0835   BILIRUBINUR NEGATIVE 11/04/2017 0835   KETONESUR 5 (A) 11/04/2017 0835   PROTEINUR NEGATIVE 11/04/2017 0835   NITRITE NEGATIVE 11/04/2017 0835   LEUKOCYTESUR SMALL (A) 11/04/2017 0835   Sepsis Labs Invalid input(s): PROCALCITONIN,  WBC,  LACTICIDVEN Microbiology No results found for this or any previous visit (from the past 240 hour(s)).   Time coordinating discharge: 35 minutes  SIGNED:   Hosie Poisson, MD  [facility] 11/06/2017,  4:44 PM Pager   If 7PM-7AM, please contact night-coverage [email or web address] Password [email or web address]

## 2017-11-06 NOTE — Progress Notes (Addendum)
Reviewed discharge information with patient and caregiver. Answered all questions. Patient/caregiver able to teach back medications and reasons to contact MD/911. Patient verbalizes importance of PCP follow up appointment. Oxygen delivered to room. Case management notified about [place] orders.  Barbee Shropshire. Brigitte Pulse, RN

## 2017-11-06 NOTE — Progress Notes (Signed)
Daily Progress Note   Patient Name: Jodi [name]       Date: 11/06/2017 DOB: 19-Jun-1945  Age: 72 y.o. MRN#: [id number] Attending Physician: Hosie Poisson, MD Primary Care Physician: Joseph Art, MD Admit Date: 11/03/2017  Reason for Consultation/Follow-up: Establishing goals of care  Subjective: Met with patient and her daughter.  We discussed conversation from yesterday and plan to continue working with Dr. Delton Coombes to develop long term goals.  She and her daughter understand that this is not curable and goal of therapies is to add time and quality to her life.  We also discussed that there may come a point where further disease modifying therapy is not likely to add quality time and, at that point, aggressive symptom management may allow her more time and quality.  She trusts Dr. Delton Coombes to guide her in this decision making.  Length of Stay: 2  Current Medications: Scheduled Meds:  . diltiazem  240 mg Oral Daily  . magic mouthwash  5 mL Oral QID   And  . lidocaine  5 mL Mouth/Throat QID  . meclizine  25 mg Oral BID  . pantoprazole  40 mg Oral Daily  . scopolamine  1 patch Transdermal Q72H  . sodium chloride flush  3 mL Intravenous Q12H    Continuous Infusions: . sodium chloride 250 mL (11/05/17 1228)  . ampicillin-sulbactam (UNASYN) IV Stopped (11/06/17 1257)    PRN Meds: sodium chloride, acetaminophen **OR** acetaminophen, ALPRAZolam, benzonatate, guaiFENesin-codeine, ondansetron, prochlorperazine, sodium chloride, sodium chloride flush  Physical Exam         General: Alert, awake, in no acute distress.  Weak/frail.  HEENT: No bruits, no goiter, no JVD Heart: Regular rate and rhythm. No murmur appreciated. Lungs: Diminished air movement, clear Abdomen: Soft,  nontender, nondistended, positive bowel sounds.  Ext: No significant edema Skin: Warm and dry Neuro: Grossly intact, nonfocal.  Vital Signs: BP 134/74 (BP Location: Left Arm)   Pulse (!) 108   Temp 97.9 F (36.6 C) (Oral)   Resp 20   Ht 5' (1.524 m)   Wt 53.1 kg (117 lb 1 oz)   SpO2 95%   BMI 22.86 kg/m  SpO2: SpO2: 95 % O2 Device: O2 Device: Nasal Cannula O2 Flow Rate: O2 Flow Rate (L/min): 1 L/min  Intake/output summary:   Intake/Output Summary (  Last 24 hours) at 11/06/2017 1557 Last data filed at 11/06/2017 0900 Gross per 24 hour  Intake 1070 ml  Output -  Net 1070 ml   LBM: Last BM Date: 11/05/17 Baseline Weight: Weight: 52.6 kg (116 lb) Most recent weight: Weight: 53.1 kg (117 lb 1 oz)       Palliative Assessment/Data:    Flowsheet Rows     Most Recent Value  Intake Tab  Referral Department  Hospitalist  Unit at Time of Referral  Med/Surg Unit  Palliative Care Primary Diagnosis  Cancer  Date Notified  11/04/17  Palliative Care Type  New Palliative care  Reason for referral  Clarify Goals of Care  Date of Admission  11/03/17  Date first seen by Palliative Care  11/05/17  # of days Palliative referral response time  1 Day(s)  # of days IP prior to Palliative referral  1  Clinical Assessment  Palliative Performance Scale Score  40%  Psychosocial & Spiritual Assessment  Palliative Care Outcomes  Patient/Family meeting held?  Yes  Who was at the meeting?  Patient  Palliative Care Outcomes  Clarified goals of care      Patient Active Problem List   Diagnosis Date Noted  . Pulmonary metastasis ([place]) 11/05/2017  . Hemoptysis 11/03/2017  . HTN (hypertension) 11/03/2017  . Anemia in other chronic diseases classified elsewhere 11/03/2017  . GERD (gastroesophageal reflux disease) 11/03/2017  . Goals of care, counseling/discussion 08/31/2017  . Urothelial cancer ([place]) 08/31/2017  . 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 female  with past medical history of metastatic bladder cancer (lung mets) admitted on 11/03/2017 with hemoptysis.  She has been following with Dr. Delton Coombes at [facility] for oncologic care.  Radiation oncology has evaluated her and plan for 2 weeks of radiation therapy.   Assessment: Patient Active Problem List   Diagnosis Date Noted  . Pulmonary metastasis ([place]) 11/05/2017  . Hemoptysis 11/03/2017  . HTN (hypertension) 11/03/2017  . Anemia in other chronic diseases classified elsewhere 11/03/2017  . GERD (gastroesophageal reflux disease) 11/03/2017  . Goals of care, counseling/discussion 08/31/2017  . Urothelial cancer ([place]) 08/31/2017  . 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  Reports needing to discuss with her oncologist regarding goals of care - Discussed advance directives.  She has named her daughter Jodi [name]) as her HCPOA.  Goals of Care and Additional Recommendations:  Limitations on Scope of Treatment: Full Scope Treatment  Code Status:    Code Status Orders  (From admission, onward)        Start     Ordered   11/03/17 1155  Full code  Continuous     11/03/17 1155    Code Status History    This patient has a current code status but no historical code status.    Advance Directive Documentation     Most Recent Value  Type of Advance Directive  Healthcare Power of Attorney  Pre-existing out of facility DNR order (yellow form or pink MOST form)  -  "MOST" Form in Place?  -       Prognosis:   Unable to determine  Discharge Planning:  Home with [place] was discussed with RN, primary attending  Thank you for allowing the Palliative Medicine Team to assist in the care of this patient.   Total Time 40  Prolonged Time Billed  no       Greater than 50%  of this time was spent counseling and coordinating care  related to the above assessment and plan.  Micheline Rough, MD  Please contact Palliative Medicine Team phone at [phone] for questions and concerns.

## 2017-11-06 NOTE — Progress Notes (Signed)
SATURATION QUALIFICATIONS: (This note is used to comply with regulatory documentation for home oxygen)  Patient Saturations on Room Air at Rest = 80%  Patient Saturations on Room Air while Ambulating = 76%  Patient Saturations on 2 Liters of oxygen while Ambulating = 94%   Barbee Shropshire. Brigitte Pulse, RN

## 2017-11-06 NOTE — Progress Notes (Signed)
[facility] Therapy Treatment Record Phone [phone]   Radiation Therapy was administered to Jodi [name] on: 11/06/2017  3:04 PM and was treatment # 3 out of a planned course of 10 treatments.  Radiation Treatment  1). Beam photons with 6-10 energy  2). Brachytherapy None  3). Stereotactic Radiosurgery None  4). Other Radiation None     Jacques Earthly, RT (T)

## 2017-11-07 NOTE — Care Management Note (Signed)
Case Management Note  Patient Details  Name: Jodi [name] MRN: [id number] Date of Birth: Feb 29, 1946  Subjective/Objective:                  Late entry for [id number] 5pm/patient dcd to home with o2 hhc services were cancelled by attending md.  Action/Plan: Home  o2 through advanced hhc/representative Santiago Glad is aware of need and will arrange.  Expected Discharge Date:  11/06/17               Expected Discharge Plan:  Home/Self Care  In-House Referral:     Discharge planning Services  CM Consult  Post Acute Care Choice:  Durable Medical Equipment Choice offered to:  Patient  DME Arranged:  Oxygen DME Agency:  [place]:    [facility] Agency:     Status of Service:  Completed, signed off  If discussed at [place] of Stay Meetings, dates discussed:    Additional Comments:  Leeroy Cha, RN 11/07/2017, 8:33 AM

## 2017-11-08 ENCOUNTER — Ambulatory Visit
Admission: RE | Admit: 2017-11-08 | Discharge: 2017-11-08 | Disposition: A | Discharge status: Home / Self Care | Payer: Medicare Other | Source: Ambulatory Visit | Attending: Radiation Oncology | Admitting: Radiation Oncology

## 2017-11-08 DIAGNOSIS — C7801 Secondary malignant neoplasm of right lung: Secondary | ICD-10-CM | POA: Diagnosis not present

## 2017-11-08 DIAGNOSIS — C679 Malignant neoplasm of bladder, unspecified: Secondary | ICD-10-CM | POA: Diagnosis not present

## 2017-11-08 DIAGNOSIS — Z510 Encounter for antineoplastic radiation therapy: Secondary | ICD-10-CM | POA: Diagnosis present

## 2017-11-11 ENCOUNTER — Ambulatory Visit (HOSPITAL_COMMUNITY): Payer: Medicare Other | Admitting: Hematology

## 2017-11-11 ENCOUNTER — Ambulatory Visit
Admission: RE | Admit: 2017-11-11 | Discharge: 2017-11-11 | Disposition: A | Discharge status: Home / Self Care | Payer: Medicare Other | Source: Ambulatory Visit | Attending: Radiation Oncology | Admitting: Radiation Oncology

## 2017-11-11 ENCOUNTER — Ambulatory Visit (HOSPITAL_COMMUNITY): Payer: Medicare Other

## 2017-11-11 DIAGNOSIS — Z510 Encounter for antineoplastic radiation therapy: Secondary | ICD-10-CM | POA: Diagnosis not present

## 2017-11-12 ENCOUNTER — Other Ambulatory Visit: Payer: Self-pay | Admitting: Radiation Oncology

## 2017-11-12 ENCOUNTER — Ambulatory Visit
Admission: RE | Admit: 2017-11-12 | Discharge: 2017-11-12 | Disposition: A | Discharge status: Home / Self Care | Payer: Medicare Other | Source: Ambulatory Visit | Attending: Radiation Oncology | Admitting: Radiation Oncology

## 2017-11-12 DIAGNOSIS — R042 Hemoptysis: Secondary | ICD-10-CM

## 2017-11-12 DIAGNOSIS — C679 Malignant neoplasm of bladder, unspecified: Secondary | ICD-10-CM

## 2017-11-12 DIAGNOSIS — C7800 Secondary malignant neoplasm of unspecified lung: Principal | ICD-10-CM

## 2017-11-12 DIAGNOSIS — Z510 Encounter for antineoplastic radiation therapy: Secondary | ICD-10-CM | POA: Diagnosis not present

## 2017-11-12 LAB — BASIC METABOLIC PANEL
ANION GAP: 6 (ref 5–15)
BUN: 15 mg/dL (ref 8–23)
CHLORIDE: 98 mmol/L (ref 98–111)
CO2: 34 mmol/L — AB (ref 22–32)
Calcium: 9.7 mg/dL (ref 8.9–10.3)
Creatinine, Ser: 0.74 mg/dL (ref 0.44–1.00)
GFR calc Af Amer: >60 mL/min (ref >60)
GFR calc non Af Amer: >60 mL/min (ref >60)
GLUCOSE: 112 mg/dL — AB (ref 70–99)
Potassium: 4.8 mmol/L (ref 3.5–5.1)
Sodium: 138 mmol/L (ref 135–145)

## 2017-11-12 LAB — CBC WITH DIFFERENTIAL (CANCER CENTER ONLY)
Basophils Absolute: 0 10*3/uL (ref 0.0–0.1)
Basophils Relative: 0 %
EOS ABS: 0.1 10*3/uL (ref 0.0–0.5)
EOS PCT: 1 %
HCT: 31.2 % — ABNORMAL LOW (ref 34.8–46.6)
HEMOGLOBIN: 9.4 g/dL — AB (ref 11.6–15.9)
LYMPHS ABS: 0.5 10*3/uL — AB (ref 0.9–3.3)
LYMPHS PCT: 5 %
MCH: 30.5 pg (ref 25.1–34.0)
MCHC: 30.1 g/dL — AB (ref 31.5–36.0)
MCV: 101.3 fL — AB (ref 79.5–101.0)
MONOS PCT: 6 %
Monocytes Absolute: 0.6 10*3/uL (ref 0.1–0.9)
NEUTROS ABS: 9.6 10*3/uL — AB (ref 1.5–6.5)
Neutrophils Relative %: 88 %
PLATELETS: 143 10*3/uL — AB (ref 145–400)
RBC: 3.08 MIL/uL — AB (ref 3.70–5.45)
RDW: 17.6 % — ABNORMAL HIGH (ref 11.2–14.5)
WBC Count: 10.9 10*3/uL — ABNORMAL HIGH (ref 3.9–10.3)

## 2017-11-12 NOTE — Progress Notes (Signed)
Patient came around following her radiation treatment for her lab results.  Patient complains of fatigue and loss of appetite.  Daughter was concerned with her blood counts.  I informed the patient that per discussion with PA Dara Lords her lab work looked good and she needs to push fluids.  Daughter voiced lack of appetite and stated the patient is taking megace for appetite stimulation.  Vitals were stable. I informed them to let us know if she spikes a fever or has signs of illness/pneumonia.  Will continue to follow as necessary.  Gloriajean Dell. Quincy Simmonds RN, BSN  BP 129/74 (BP Location: Left Arm, Patient Position: Sitting, Cuff Size: Normal)   Pulse (!) 124   Temp 97.7 F (36.5 C) (Oral)   Resp 16   SpO2 98% Comment: 2 liters O2

## 2017-11-13 ENCOUNTER — Ambulatory Visit
Admission: RE | Admit: 2017-11-13 | Discharge: 2017-11-13 | Disposition: A | Discharge status: Home / Self Care | Payer: Medicare Other | Source: Ambulatory Visit | Attending: Radiation Oncology | Admitting: Radiation Oncology

## 2017-11-13 DIAGNOSIS — Z510 Encounter for antineoplastic radiation therapy: Secondary | ICD-10-CM | POA: Diagnosis not present

## 2017-11-14 ENCOUNTER — Ambulatory Visit: Payer: Medicare Other

## 2017-11-15 ENCOUNTER — Ambulatory Visit: Payer: Medicare Other

## 2017-11-18 ENCOUNTER — Ambulatory Visit: Payer: Medicare Other

## 2017-11-21 ENCOUNTER — Encounter (HOSPITAL_COMMUNITY): Payer: Self-pay | Admitting: *Deleted

## 2017-11-21 NOTE — Progress Notes (Signed)
We received notification from home health that patient has expired.  Her chart has been updated.

## 2017-11-27 ENCOUNTER — Ambulatory Visit (HOSPITAL_COMMUNITY): Payer: Medicare Other | Admitting: Hematology

## 2017-11-27 ENCOUNTER — Ambulatory Visit (HOSPITAL_COMMUNITY): Payer: Medicare Other

## 2017-11-27 ENCOUNTER — Encounter: Payer: Self-pay | Admitting: Radiation Oncology

## 2017-11-27 NOTE — Progress Notes (Signed)
  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  Plan: The patient died after discontinuing radiation. ________________________________  Jodelle Gross, M.D., Ph.D.  This document serves as a record of services personally performed by Kyung Rudd, MD. It was created on his behalf by Wilburn Mylar, a trained medical scribe. The creation of this record is based on the scribe's personal observations and the provider's statements to them. This document has been checked and approved by the attending provider.

## 2017-12-05 DEATH — deceased

## 2017-12-09 ENCOUNTER — Ambulatory Visit: Payer: Self-pay | Admitting: Radiation Oncology

## 2018-11-24 IMAGING — CT CT CHEST W/ CM
2 of 3 series · 14 of 36 positions shown, 17 images · IV contrast (iopamidol)
Comparison: Chest radiograph dated 11/03/2017

CLINICAL DATA: 71-year-old female with hemoptysis. 71-year-old
female with hemoptysis

EXAM:
CT CHEST WITH CONTRAST
TECHNIQUE: Multidetector CT imaging of the chest was performed during
intravenous contrast administration.
CONTRAST:  75mL [id number] IOPAMIDOL ([id number]) INJECTION 61%

[Series 2: axial st · axial · 0.63mm/px · z∈[+1709,+1937]mm · 11 of 134 slices shown, 14 images]
[im 10/134  mediastinal]
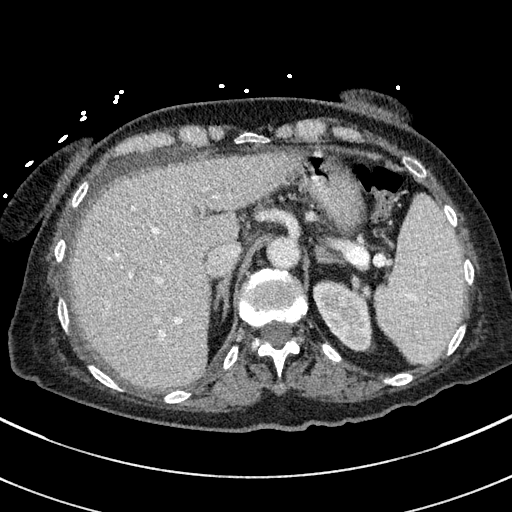
[im 10/134  lung]
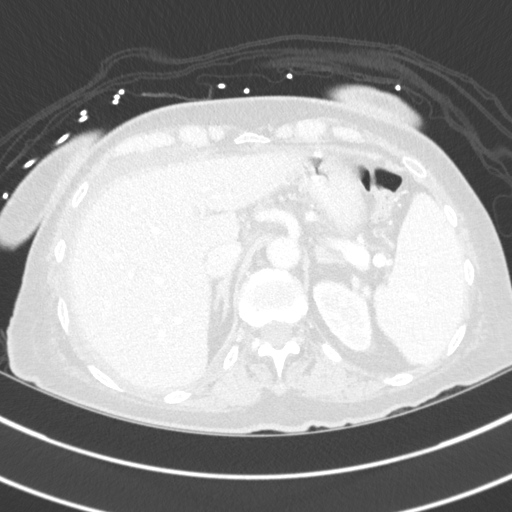
[im 20/134  lung]
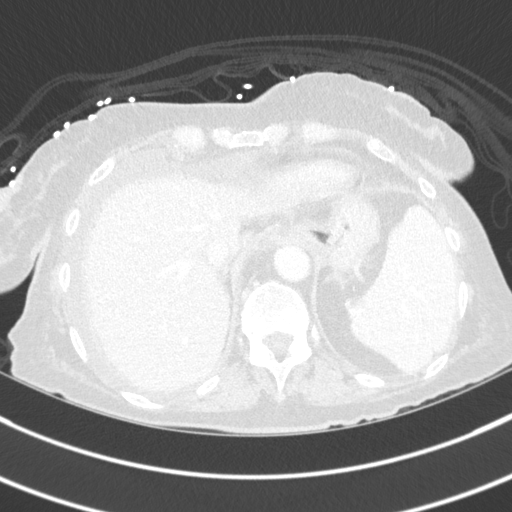
[im 30/134  lung]
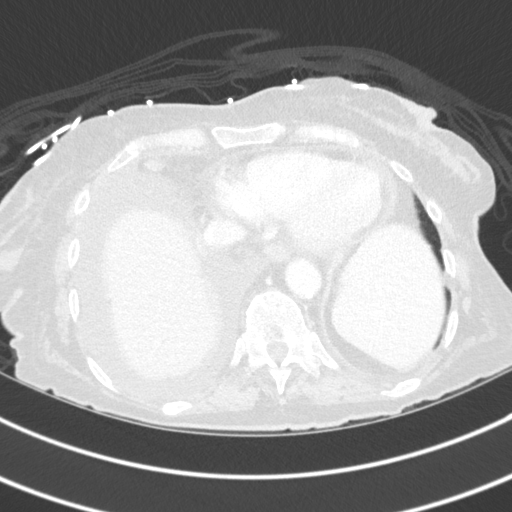
[im 45/134  lung]
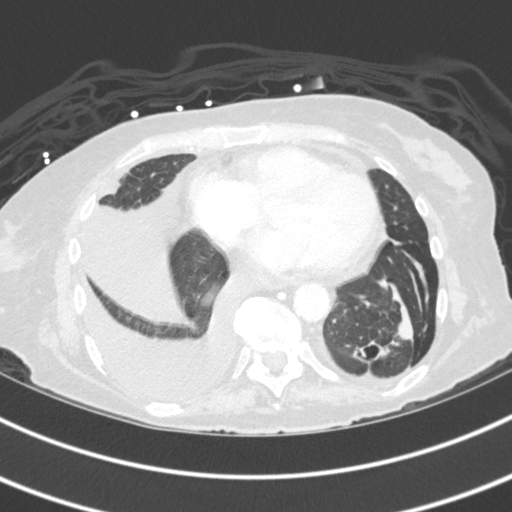
[im 55/134  mediastinal]
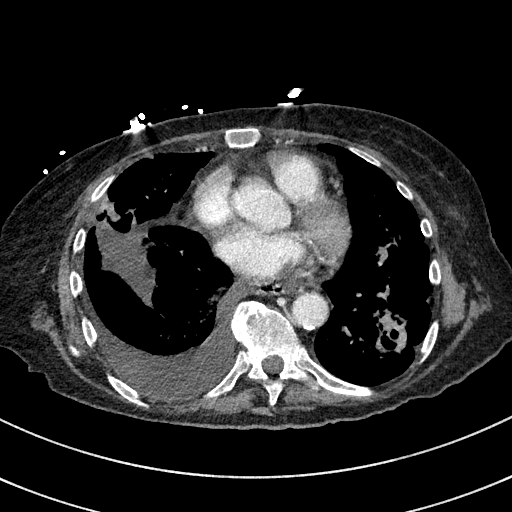
[im 55/134  lung]
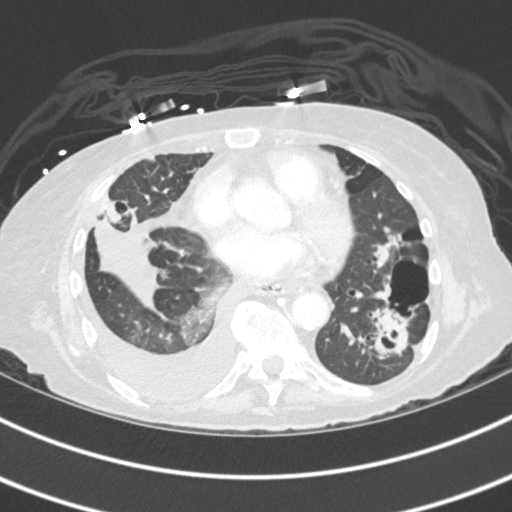
[im 69/134  lung]
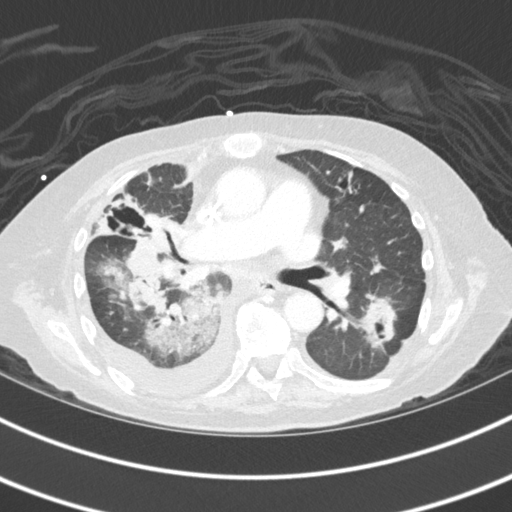
[im 79/134  lung]
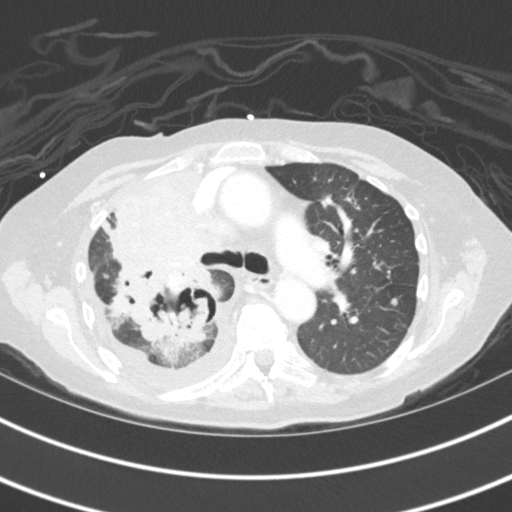
[im 89/134  lung]
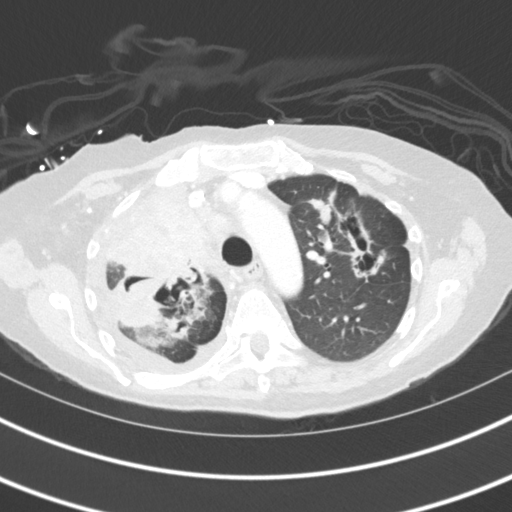
[im 104/134  mediastinal]
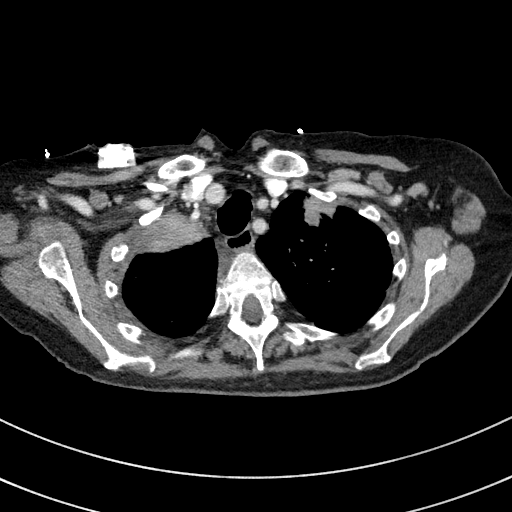
[im 104/134  lung]
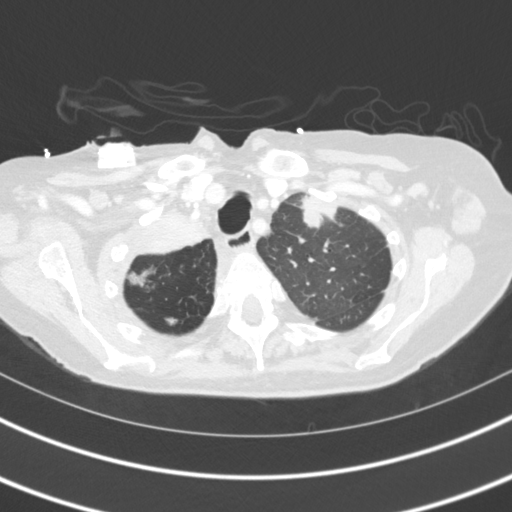
[im 114/134  lung]
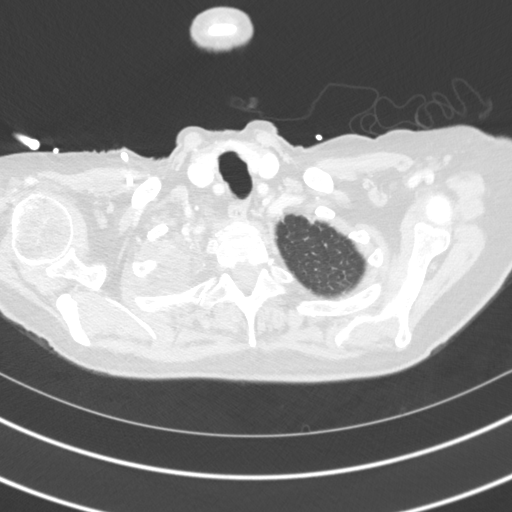
[im 124/134  lung]
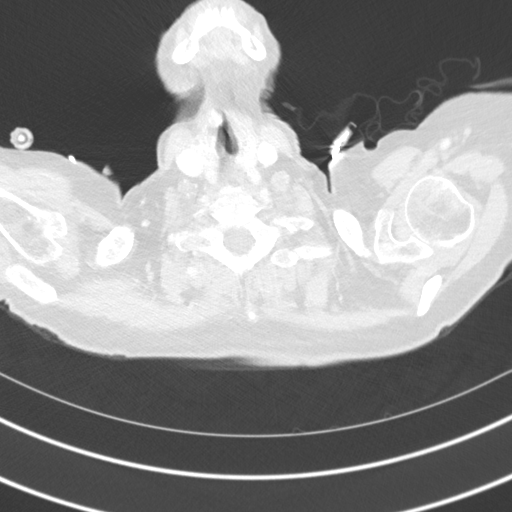

[Series 5: coronal · coronal · 0.57mm/px · 3 of 111 slices shown]
[im 23/111  lung]
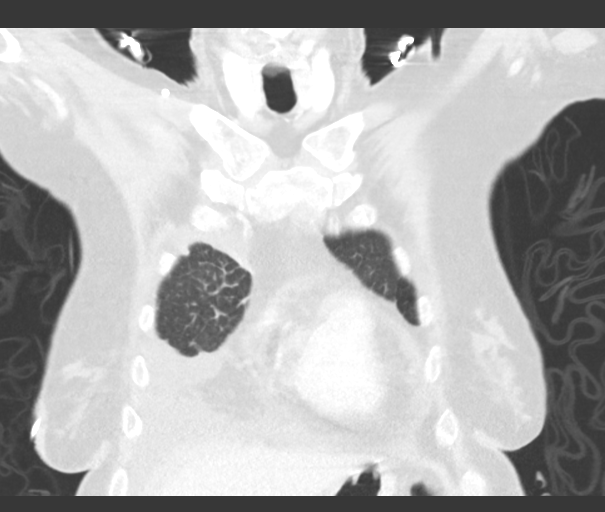
[im 45/111  lung]
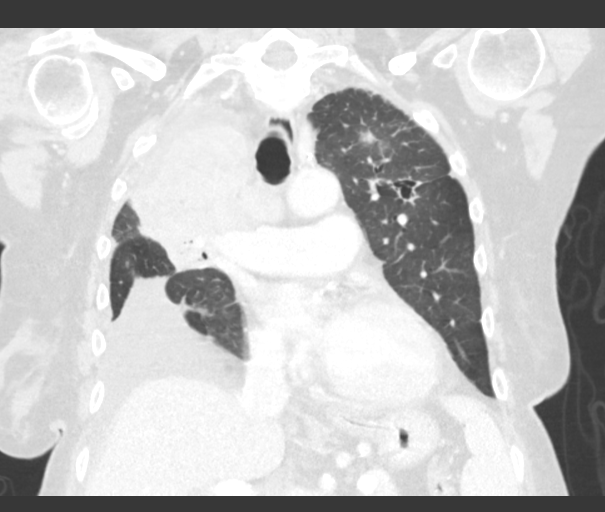
[im 67/111  lung]
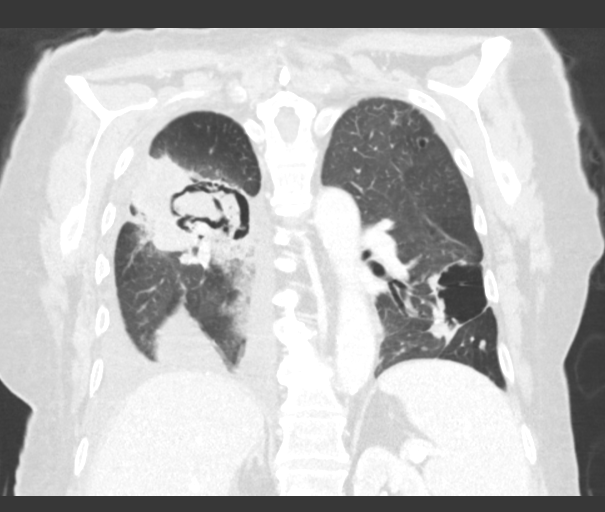

[14 of 36 positions shown; findings below may reference images not displayed]

FINDINGS: Cardiovascular: Top-normal cardiac size. There is multi vessel
coronary vascular calcification. There is a small pericardial
effusion. There is mild atherosclerotic calcification of the
thoracic aorta. No aneurysmal dilatation or dissection. Evaluation
of the pulmonary arteries is limited due to suboptimal visualization
of the peripheral branches. No large or central pulmonary artery
embolus identified. Right pectoral Port-A-Cath with tip at the
cavoatrial junction.

Mediastinum/Nodes: Subcarinal lymph node measures 10 cm in short
axis. Large right hilar mass extending into the mediastinum anterior
to the trachea (see below). Esophagus is grossly unremarkable. A
lymph node posterior to the trachea and along the right thoracic
esophagus measures 6 mm in short axis. No axillary adenopathy.

Lungs/Pleura: There is enlarged and irregular right upper lobe mass
measuring approximately 9.7 x 6.4 x 6.4 cm. The mass extends from
the right apical and right upper anterior pleural surface into the
right hilar region. A 2.5 x 2.0 cm ovoid lesion anterior to the
trachea in the mediastinum may represent extension of the mass into
the mediastinum or a large mediastinal lymph nodes. There is mass
effect and occlusion of the right upper lobe bronchus. There are
areas of cavitary change within the lung parenchyma in the periphery
of the mass. The right upper lobe mass extends into the right middle
and right lower lobe. Patchy areas of ground-glass density in the
right lower lobe adjacent to the mass may represent tumor
infiltration or superimposed infection. There is a 1.8 x 2.3 cm left
upper lobe solid nodule with irregular margins consistent with
malignancy or metastatic disease. Additional smaller nodules
throughout the left lung consistent with metastatic disease. There
are areas of cavitary changes in the left upper lobe and left lower
lobe. There are small bilateral pleural effusions, right greater
left. No pneumothorax. The central airways are patent.

Upper Abdomen: Subcentimeter splenic hypodense lesion is not
characterized. The visualized upper abdomen is otherwise
unremarkable.

Musculoskeletal: Osteopenia with degenerative changes of the spine.
No acute osseous pathology. Small lucent lesions throughout the
spine may be secondary to osteopenia or represent metastatic
disease.
IMPRESSION: 1. Large right hilar/suprahilar mass spanning from the right upper
lobe into the right middle and right lower lobes. There is complete
occlusion of the right upper lobe bronchus.
2. Multiple left lung pulmonary nodules consistent with metastatic
disease.
3. Bilateral pulmonary cavitary lesions with intracavitary content
which may represent necrotic debris, or fungal ball.
4. Patchy nodular and ground-glass density in the right lower lobe
may represent tumor infiltration or pneumonia.
5. Small bilateral pleural effusions, right greater left.
6. Extension of the right upper lobe mass into the mediastinum
anterior to the trachea.
7. No CT evidence of central pulmonary artery embolus.
